# Patient Record
Sex: Male | Born: 1985 | Race: Black or African American | Hispanic: No | Marital: Single | Smoking: Current every day smoker
Health system: Southern US, Community
[De-identification: ages and names within clinical notes are randomized; demographics above are authoritative.]

---
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
## 2006-02-07 ENCOUNTER — Emergency Department (HOSPITAL_COMMUNITY): Admission: EM | Admit: 2006-02-07 | Discharge: 2006-02-08 | Payer: Self-pay | Admitting: Emergency Medicine

## 2006-03-09 ENCOUNTER — Emergency Department (HOSPITAL_COMMUNITY): Admission: EM | Admit: 2006-03-09 | Discharge: 2006-03-09 | Payer: Self-pay | Admitting: Emergency Medicine

## 2006-03-14 ENCOUNTER — Emergency Department (HOSPITAL_COMMUNITY): Admission: EM | Admit: 2006-03-14 | Discharge: 2006-03-14 | Payer: Self-pay | Admitting: Emergency Medicine

## 2006-04-10 ENCOUNTER — Emergency Department (HOSPITAL_COMMUNITY): Admission: EM | Admit: 2006-04-10 | Discharge: 2006-04-10 | Payer: Self-pay | Admitting: Emergency Medicine

## 2008-03-17 ENCOUNTER — Emergency Department (HOSPITAL_COMMUNITY): Admission: EM | Admit: 2008-03-17 | Discharge: 2008-03-17 | Payer: Self-pay | Admitting: Emergency Medicine

## 2010-07-20 ENCOUNTER — Emergency Department (HOSPITAL_COMMUNITY): Admission: EM | Admit: 2010-07-20 | Discharge: 2010-07-20 | Payer: Self-pay | Admitting: Emergency Medicine

## 2010-08-12 ENCOUNTER — Emergency Department (HOSPITAL_COMMUNITY): Admission: EM | Admit: 2010-08-12 | Discharge: 2010-08-12 | Payer: Self-pay | Admitting: Emergency Medicine

## 2010-09-20 ENCOUNTER — Emergency Department (HOSPITAL_COMMUNITY)
Admission: EM | Admit: 2010-09-20 | Discharge: 2010-09-20 | Payer: Self-pay | Source: Home / Self Care | Admitting: Emergency Medicine

## 2010-11-21 ENCOUNTER — Emergency Department (HOSPITAL_COMMUNITY): Payer: Medicaid Other

## 2010-11-21 ENCOUNTER — Emergency Department (HOSPITAL_COMMUNITY)
Admission: EM | Admit: 2010-11-21 | Discharge: 2010-11-21 | Disposition: A | Discharge status: Home / Self Care | Payer: Medicaid Other | Attending: Emergency Medicine | Admitting: Emergency Medicine

## 2010-11-21 DIAGNOSIS — X58XXXA Exposure to other specified factors, initial encounter: Secondary | ICD-10-CM | POA: Insufficient documentation

## 2010-11-21 DIAGNOSIS — S63509A Unspecified sprain of unspecified wrist, initial encounter: Secondary | ICD-10-CM | POA: Insufficient documentation

## 2011-01-11 ENCOUNTER — Emergency Department (HOSPITAL_COMMUNITY)
Admission: EM | Admit: 2011-01-11 | Discharge: 2011-01-11 | Disposition: A | Discharge status: Home / Self Care | Payer: Medicaid Other | Attending: Emergency Medicine | Admitting: Emergency Medicine

## 2011-01-11 DIAGNOSIS — M545 Low back pain, unspecified: Secondary | ICD-10-CM | POA: Insufficient documentation

## 2011-02-01 ENCOUNTER — Emergency Department (HOSPITAL_COMMUNITY)
Admission: EM | Admit: 2011-02-01 | Discharge: 2011-02-01 | Disposition: A | Discharge status: Home / Self Care | Payer: Medicaid Other | Attending: Emergency Medicine | Admitting: Emergency Medicine

## 2011-02-01 DIAGNOSIS — Y9269 Other specified industrial and construction area as the place of occurrence of the external cause: Secondary | ICD-10-CM | POA: Insufficient documentation

## 2011-02-01 DIAGNOSIS — X503XXA Overexertion from repetitive movements, initial encounter: Secondary | ICD-10-CM | POA: Insufficient documentation

## 2011-02-01 DIAGNOSIS — S335XXA Sprain of ligaments of lumbar spine, initial encounter: Secondary | ICD-10-CM | POA: Insufficient documentation

## 2011-03-12 ENCOUNTER — Emergency Department (HOSPITAL_COMMUNITY)
Admission: EM | Admit: 2011-03-12 | Discharge: 2011-03-12 | Disposition: A | Discharge status: Home / Self Care | Payer: Medicaid Other | Attending: Emergency Medicine | Admitting: Emergency Medicine

## 2011-03-12 DIAGNOSIS — F17200 Nicotine dependence, unspecified, uncomplicated: Secondary | ICD-10-CM | POA: Insufficient documentation

## 2011-03-12 DIAGNOSIS — M545 Low back pain, unspecified: Secondary | ICD-10-CM | POA: Insufficient documentation

## 2011-07-11 ENCOUNTER — Encounter: Payer: Self-pay | Admitting: *Deleted

## 2011-07-11 ENCOUNTER — Emergency Department (HOSPITAL_COMMUNITY)
Admission: EM | Admit: 2011-07-11 | Discharge: 2011-07-11 | Disposition: A | Discharge status: Home / Self Care | Payer: Medicaid Other | Attending: Emergency Medicine | Admitting: Emergency Medicine

## 2011-07-11 DIAGNOSIS — N63 Unspecified lump in unspecified breast: Secondary | ICD-10-CM

## 2011-07-11 DIAGNOSIS — F17200 Nicotine dependence, unspecified, uncomplicated: Secondary | ICD-10-CM | POA: Insufficient documentation

## 2011-07-11 MED ORDER — DOXYCYCLINE HYCLATE 100 MG PO TABS
100.0000 mg | ORAL_TABLET | Freq: Once | ORAL | Status: AC
  Administered 2011-07-11: 100 mg via ORAL
  Filled 2011-07-11: qty 1

## 2011-07-11 MED ORDER — DOXYCYCLINE HYCLATE 100 MG PO CAPS: 100.0000 mg | ORAL_CAPSULE | Freq: Two times a day (BID) | ORAL | Status: DC

## 2011-07-11 NOTE — ED Provider Notes (Signed)
History     CSN: [id number] Arrival date & time: 07/11/2011 11:16 AM   First MD Initiated Contact with Patient 07/11/11 1341      Chief Complaint  Patient presents with  . Cyst    (Consider location/radiation/quality/duration/timing/severity/associated sxs/prior treatment) HPI Comments: Pt has a painful mass in the L breast for over 1 week.  No trauma.  No fever.  The history is provided by the patient. No language interpreter was used.    History reviewed. No pertinent past medical history.  History reviewed. No pertinent past surgical history.  History reviewed. No pertinent family history.  History  Substance Use Topics  . Smoking status: Current Everyday Smoker -- 0.5 packs/day  . Smokeless tobacco: Not on file  . Alcohol Use: Yes     occasionally      Review of Systems  Skin:       mass  All other systems reviewed and are negative.    Allergies  Review of patient's allergies indicates no known allergies.  Home Medications  No current outpatient prescriptions on file.  BP 132/73  Pulse 81  Temp(Src) 98.6 F (37 C) (Oral)  Resp 20  Ht 5\' 11"  (1.803 m)  Wt 280 lb (127.007 kg)  BMI 39.05 kg/m2  SpO2 100%  Physical Exam  Nursing note and vitals reviewed. Constitutional: He is oriented to person, place, and time. He appears well-developed and well-nourished. No distress.  HENT:  Head: Normocephalic and atraumatic.  Eyes: EOM are normal.  Neck: Normal range of motion.  Cardiovascular: Normal rate, regular rhythm, normal heart sounds and intact distal pulses.   Pulmonary/Chest: Effort normal and breath sounds normal. No respiratory distress. He exhibits mass and tenderness. He exhibits no bony tenderness and no retraction.    Abdominal: Soft. He exhibits no distension. There is no tenderness.  Musculoskeletal: Normal range of motion.  Lymphadenopathy:    He has no axillary adenopathy.  Neurological: He is alert and oriented to person, place, and  time.  Skin: Skin is warm and dry.  Psychiatric: He has a normal mood and affect. Judgment normal.    ED Course  Procedures (including critical care time)  Labs Reviewed - No data to display No results found.   No diagnosis found.    MDM          Worthy Rancher, PA 07/11/11 1409

## 2011-07-11 NOTE — ED Notes (Signed)
\  [id number]\

## 2011-07-11 NOTE — ED Notes (Signed)
Pt states he has hard tender knot to left chest.

## 2011-07-13 NOTE — ED Provider Notes (Signed)
Medical screening examination/treatment/procedure(s) were performed by non-physician practitioner and as supervising physician I was immediately available for consultation/collaboration.   Swan Zayed L Roshad Hack, MD 07/13/11 0904 

## 2011-07-18 ENCOUNTER — Ambulatory Visit (HOSPITAL_COMMUNITY): Admit: 2011-07-18 | Payer: Medicaid Other

## 2011-07-18 ENCOUNTER — Encounter (HOSPITAL_COMMUNITY): Payer: Medicaid Other

## 2011-07-18 ENCOUNTER — Other Ambulatory Visit (HOSPITAL_COMMUNITY): Payer: Self-pay | Admitting: Emergency Medicine

## 2011-07-18 DIAGNOSIS — N63 Unspecified lump in unspecified breast: Secondary | ICD-10-CM

## 2011-07-19 ENCOUNTER — Emergency Department (HOSPITAL_COMMUNITY)
Admission: EM | Admit: 2011-07-19 | Discharge: 2011-07-19 | Disposition: A | Discharge status: Home / Self Care | Payer: Medicaid Other | Attending: Emergency Medicine | Admitting: Emergency Medicine

## 2011-07-19 ENCOUNTER — Encounter (HOSPITAL_COMMUNITY): Payer: Self-pay | Admitting: *Deleted

## 2011-07-19 ENCOUNTER — Other Ambulatory Visit (HOSPITAL_COMMUNITY): Payer: Medicaid Other

## 2011-07-19 DIAGNOSIS — K0889 Other specified disorders of teeth and supporting structures: Secondary | ICD-10-CM

## 2011-07-19 DIAGNOSIS — F17200 Nicotine dependence, unspecified, uncomplicated: Secondary | ICD-10-CM | POA: Insufficient documentation

## 2011-07-19 DIAGNOSIS — K089 Disorder of teeth and supporting structures, unspecified: Secondary | ICD-10-CM | POA: Insufficient documentation

## 2011-07-19 DIAGNOSIS — K029 Dental caries, unspecified: Secondary | ICD-10-CM | POA: Insufficient documentation

## 2011-07-19 MED ORDER — MELOXICAM 7.5 MG PO TABS: 7.5000 mg | ORAL_TABLET | Freq: Every day | ORAL | Status: DC

## 2011-07-19 MED ORDER — HYDROCODONE-ACETAMINOPHEN 5-325 MG PO TABS: 2.0000 | ORAL_TABLET | ORAL | Status: AC | PRN

## 2011-07-19 MED ORDER — PENICILLIN V POTASSIUM 500 MG PO TABS: ORAL_TABLET | ORAL | Status: DC

## 2011-07-19 NOTE — ED Notes (Signed)
C/o right lower toothache x 2 days; states tooth is broken

## 2011-07-19 NOTE — ED Provider Notes (Signed)
History     CSN: [id number] Arrival date & time: 07/19/2011 11:04 AM   First MD Initiated Contact with Patient 07/19/11 1156      Chief Complaint  Patient presents with  . Dental Pain    x 2 days    (Consider location/radiation/quality/duration/timing/severity/associated sxs/prior treatment) HPI Comments: Pt  States he had a tooth to break about 1 month ago. 2 days ago he developed increasing pain on the right lower jaw area.  Patient is a 25 y.o. male presenting with tooth pain. The history is provided by the patient.  Dental PainThe primary symptoms include mouth pain and dental injury. Primary symptoms do not include fever, shortness of breath or cough. The symptoms began 2 days ago. The symptoms are worsening. The symptoms occur intermittently.  Additional symptoms include: dental sensitivity to temperature, gum swelling, gum tenderness and jaw pain. Additional symptoms do not include: nosebleeds. Medical issues include: alcohol problem and smoking.    History reviewed. No pertinent past medical history.  History reviewed. No pertinent past surgical history.  No family history on file.  History  Substance Use Topics  . Smoking status: Current Everyday Smoker -- 0.5 packs/day    Types: Cigarettes  . Smokeless tobacco: Not on file  . Alcohol Use: Yes     occasionally      Review of Systems  Constitutional: Negative for fever and activity change.       All ROS Neg except as noted in HPI  HENT: Positive for dental problem. Negative for nosebleeds and neck pain.   Eyes: Negative for photophobia and discharge.  Respiratory: Negative for cough, shortness of breath and wheezing.   Cardiovascular: Negative for chest pain and palpitations.  Gastrointestinal: Negative for abdominal pain and blood in stool.  Genitourinary: Negative for dysuria, frequency and hematuria.  Musculoskeletal: Negative for back pain and arthralgias.  Skin: Negative.   Neurological: Negative for  dizziness, seizures and speech difficulty.  Psychiatric/Behavioral: Negative for hallucinations and confusion.    Allergies  Review of patient's allergies indicates no known allergies.  Home Medications   Current Outpatient Rx  Name Route Sig Dispense Refill  . HYDROCODONE-ACETAMINOPHEN 5-325 MG PO TABS Oral Take 2 tablets by mouth every 4 (four) hours as needed for pain. 20 tablet 0  . MELOXICAM 7.5 MG PO TABS Oral Take 1 tablet (7.5 mg total) by mouth daily. 14 tablet 0  . PENICILLIN V POTASSIUM 500 MG PO TABS  2 po bid with food 28 tablet 0    BP 127/72  Pulse 91  Temp(Src) 98.6 F (37 C) (Oral)  Resp 20  Ht 6' (1.829 m)  Wt 250 lb (113.399 kg)  BMI 33.91 kg/m2  SpO2 94%  Physical Exam  Nursing note and vitals reviewed. Constitutional: He is oriented to person, place, and time. He appears well-developed and well-nourished.  Non-toxic appearance.  HENT:  Head: Normocephalic.  Right Ear: Tympanic membrane and external ear normal.  Left Ear: Tympanic membrane and external ear normal.  Mouth/Throat: Dental caries present.  Eyes: EOM and lids are normal. Pupils are equal, round, and reactive to light.  Neck: Normal range of motion. Neck supple. Carotid bruit is not present.  Cardiovascular: Normal rate, regular rhythm, normal heart sounds, intact distal pulses and normal pulses.   Pulmonary/Chest: Breath sounds normal. No respiratory distress.  Abdominal: Soft. Bowel sounds are normal. There is no tenderness. There is no guarding.  Musculoskeletal: Normal range of motion.  Lymphadenopathy:  Head (right side): No submandibular adenopathy present.       Head (left side): No submandibular adenopathy present.    He has no cervical adenopathy.  Neurological: He is alert and oriented to person, place, and time. He has normal strength. No cranial nerve deficit or sensory deficit.  Skin: Skin is warm and dry.  Psychiatric: He has a normal mood and affect. His speech is  normal.    ED Course: Pt to see a dentist as soon as possible.  Procedures (including critical care time)  Labs Reviewed - No data to display No results found.   1. Toothache       MDM  I have reviewed nursing notes, vital signs, and all appropriate lab and imaging results for this patient.        Kathie Dike, [place] 07/19/11 1745

## 2011-07-20 NOTE — ED Provider Notes (Signed)
Medical screening examination/treatment/procedure(s) were performed by non-physician practitioner and as supervising physician I was immediately available for consultation/collaboration.  Danely Bayliss, MD 07/20/11 0744 

## 2011-08-24 ENCOUNTER — Encounter (HOSPITAL_COMMUNITY): Payer: Self-pay | Admitting: *Deleted

## 2011-08-24 ENCOUNTER — Emergency Department (HOSPITAL_COMMUNITY)
Admission: EM | Admit: 2011-08-24 | Discharge: 2011-08-24 | Disposition: A | Discharge status: Home / Self Care | Payer: Medicaid Other | Attending: Emergency Medicine | Admitting: Emergency Medicine

## 2011-08-24 DIAGNOSIS — K0262 Dental caries on smooth surface penetrating into dentin: Secondary | ICD-10-CM | POA: Insufficient documentation

## 2011-08-24 DIAGNOSIS — F17200 Nicotine dependence, unspecified, uncomplicated: Secondary | ICD-10-CM | POA: Insufficient documentation

## 2011-08-24 DIAGNOSIS — K047 Periapical abscess without sinus: Secondary | ICD-10-CM | POA: Insufficient documentation

## 2011-08-24 DIAGNOSIS — R51 Headache: Secondary | ICD-10-CM | POA: Insufficient documentation

## 2011-08-24 DIAGNOSIS — H9209 Otalgia, unspecified ear: Secondary | ICD-10-CM | POA: Insufficient documentation

## 2011-08-24 MED ORDER — HYDROCODONE-ACETAMINOPHEN 5-325 MG PO TABS: 1.0000 | ORAL_TABLET | ORAL | Status: AC | PRN

## 2011-08-24 MED ORDER — PENICILLIN V POTASSIUM 500 MG PO TABS: 500.0000 mg | ORAL_TABLET | Freq: Four times a day (QID) | ORAL | Status: AC

## 2011-08-24 MED ORDER — NAPROXEN 500 MG PO TABS: 500.0000 mg | ORAL_TABLET | Freq: Two times a day (BID) | ORAL | Status: DC

## 2011-08-24 NOTE — ED Provider Notes (Signed)
Medical screening examination/treatment/procedure(s) were performed by non-physician practitioner and as supervising physician I was immediately available for consultation/collaboration.  Donnetta Hutching, MD 08/24/11 [phone]

## 2011-08-24 NOTE — ED Notes (Signed)
Pt c/o right ear pain and right sided tooth pain; pt states he has an appoint in January to have tooth pulled

## 2011-08-24 NOTE — ED Provider Notes (Signed)
History     CSN: [id number] Arrival date & time: 08/24/2011 10:56 AM   First MD Initiated Contact with Patient 08/24/11 1058      Chief Complaint  Patient presents with  . Otalgia  . Dental Pain  . Headache    (Consider location/radiation/quality/duration/timing/severity/associated sxs/prior treatment) Patient is a 25 y.o. male presenting with ear pain, tooth pain, and headaches. The history is provided by the patient.  Otalgia This is a new problem. The current episode started more than 2 days ago. There is pain in the right ear. The problem occurs constantly. The problem has not changed since onset.There has been no fever. The pain is at a severity of 10/10. The pain is severe. Associated symptoms include headaches. Pertinent negatives include no sore throat. Associated symptoms comments: Dental problem .  Dental PainThe primary symptoms include mouth pain and headaches. Primary symptoms do not include sore throat. The symptoms began more than 1 week ago. The symptoms are worsening. The symptoms occur constantly.  Additional symptoms include: dental sensitivity to temperature, facial swelling and ear pain. Additional symptoms do not include: gum swelling, gum tenderness, purulent gums and trouble swallowing.   Headache     History reviewed. No pertinent past medical history.  History reviewed. No pertinent past surgical history.  History reviewed. No pertinent family history.  History  Substance Use Topics  . Smoking status: Current Everyday Smoker -- 0.5 packs/day    Types: Cigarettes  . Smokeless tobacco: Not on file  . Alcohol Use: Yes     occasionally      Review of Systems  HENT: Positive for ear pain, facial swelling and dental problem. Negative for sore throat and trouble swallowing.   Neurological: Positive for headaches.    Allergies  Review of patient's allergies indicates no known allergies.  Home Medications   Current Outpatient Rx  Name Route Sig  Dispense Refill  . HYDROCODONE-ACETAMINOPHEN 5-325 MG PO TABS Oral Take 1 tablet by mouth every 4 (four) hours as needed for pain. 15 tablet 0  . MELOXICAM 7.5 MG PO TABS Oral Take 1 tablet (7.5 mg total) by mouth daily. 14 tablet 0  . NAPROXEN 500 MG PO TABS Oral Take 1 tablet (500 mg total) by mouth 2 (two) times daily with a meal. 20 tablet 0  . PENICILLIN V POTASSIUM 500 MG PO TABS  2 po bid with food 28 tablet 0  . PENICILLIN V POTASSIUM 500 MG PO TABS Oral Take 1 tablet (500 mg total) by mouth 4 (four) times daily. 40 tablet 0    BP 143/82  Pulse 81  Temp(Src) 98.2 F (36.8 C) (Oral)  Resp 18  Ht 5\' 11"  (1.803 m)  Wt 250 lb (113.399 kg)  BMI 34.87 kg/m2  SpO2 100%  Physical Exam  Constitutional: He is oriented to person, place, and time. He appears well-developed and well-nourished. No distress.  HENT:  Head: Normocephalic and atraumatic.  Right Ear: Tympanic membrane and external ear normal.  Left Ear: Tympanic membrane and external ear normal.  Mouth/Throat: Oropharynx is clear and moist and mucous membranes are normal. No oral lesions. Dental abscesses present.         Deep caries in this tooth,  No gingival edema.  Eyes: Conjunctivae are normal.  Neck: Normal range of motion. Neck supple.  Cardiovascular: Normal rate and normal heart sounds.   Pulmonary/Chest: Effort normal.  Abdominal: He exhibits no distension.  Musculoskeletal: Normal range of motion.  Lymphadenopathy:    He  has no cervical adenopathy.  Neurological: He is alert and oriented to person, place, and time.  Skin: Skin is warm and dry. No erythema.  Psychiatric: He has a normal mood and affect.    ED Course  Procedures (including critical care time)  Labs Reviewed - No data to display No results found.   1. Dental caries extending into dentine       MDM  Dental pain from caries.  No obvious infection at this time.  Patient was referred to his dentist to Dr. Barbette Merino for dental extraction of  this tooth but will not be seen until 1.7.13        Candis Musa, PA 08/24/11 1156

## 2011-08-24 NOTE — ED Notes (Signed)
Pt a/ox4. resp even and unlabored. NAD at this time. D/C instructions and Rx x3 reviewed with pt. Pt verbalized understanding. Pt ambulated to lobby with steady gate.

## 2011-08-24 NOTE — ED Notes (Signed)
Pt presents with right upper tooth pain and right ear pain. Pt states he has an appointment at the beginning of the year to have tooth pulled but the pain is "too much".

## 2012-02-11 ENCOUNTER — Emergency Department: Payer: Self-pay | Admitting: *Deleted

## 2012-02-27 ENCOUNTER — Emergency Department (HOSPITAL_COMMUNITY): Payer: Medicaid Other

## 2012-02-27 ENCOUNTER — Encounter (HOSPITAL_COMMUNITY): Payer: Self-pay | Admitting: Emergency Medicine

## 2012-02-27 ENCOUNTER — Emergency Department (HOSPITAL_COMMUNITY)
Admission: EM | Admit: 2012-02-27 | Discharge: 2012-02-27 | Disposition: A | Discharge status: Home / Self Care | Payer: Medicaid Other | Attending: Emergency Medicine | Admitting: Emergency Medicine

## 2012-02-27 DIAGNOSIS — M79672 Pain in left foot: Secondary | ICD-10-CM

## 2012-02-27 DIAGNOSIS — F17200 Nicotine dependence, unspecified, uncomplicated: Secondary | ICD-10-CM | POA: Insufficient documentation

## 2012-02-27 DIAGNOSIS — M79609 Pain in unspecified limb: Secondary | ICD-10-CM | POA: Insufficient documentation

## 2012-02-27 MED ORDER — IBUPROFEN 400 MG PO TABS
400.0000 mg | ORAL_TABLET | Freq: Once | ORAL | Status: AC
  Administered 2012-02-27: 400 mg via ORAL
  Filled 2012-02-27: qty 1

## 2012-02-27 MED ORDER — TRAMADOL HCL 50 MG PO TABS: 50.0000 mg | ORAL_TABLET | Freq: Four times a day (QID) | ORAL | Status: AC | PRN

## 2012-02-27 MED ORDER — DIPHENHYDRAMINE HCL 25 MG PO CAPS
ORAL_CAPSULE | ORAL | Status: AC
  Filled 2012-02-27: qty 2

## 2012-02-27 MED ORDER — DIPHENHYDRAMINE HCL 25 MG PO CAPS
50.0000 mg | ORAL_CAPSULE | Freq: Once | ORAL | Status: AC
  Administered 2012-02-27: 50 mg via ORAL

## 2012-02-27 NOTE — ED Notes (Signed)
Pt denies any injury, complains of bilateral foot pain that radiates up his leg. Pain has been increasing over the last month. Pt wears steel toed shoes & walks on concrete at work.

## 2012-02-27 NOTE — ED Notes (Signed)
Patient complaining of bilateral foot pain x 1 month. Denies injury.

## 2012-02-27 NOTE — ED Provider Notes (Signed)
History     CSN: [id number]  Arrival date & time 02/27/12  2107   First MD Initiated Contact with Patient 02/27/12 2142      Chief Complaint  Patient presents with  . 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  No known injury to feet.  Clinically he appears to be flat footed.  Patient is a 26 y.o. male presenting with lower extremity pain. The history is provided by the patient. No language interpreter was used.  Foot Pain This is a chronic problem. Episode onset: hurting in B feet for aver 1 month. The problem occurs constantly. Pertinent negatives include no weakness. The symptoms are aggravated by walking and standing. He has tried nothing for the symptoms.    History reviewed. No pertinent past medical history.  History reviewed. No pertinent past surgical history.  History reviewed. No pertinent family history.  History  Substance Use Topics  . Smoking status: Current Everyday Smoker -- 0.5 packs/day    Types: Cigarettes  . Smokeless tobacco: Not on file  . Alcohol Use: Yes     occasionally      Review of Systems  Musculoskeletal:       Foot pain   Neurological: Negative for weakness.  All other systems reviewed and are negative.    Allergies  Ibuprofen  Home Medications   Current Outpatient Rx  Name Route Sig Dispense Refill  . ALBUTEROL SULFATE HFA 108 (90 BASE) MCG/ACT IN AERS Inhalation Inhale 2 puffs into the lungs every 6 (six) hours as needed.    . IBUPROFEN 200 MG PO TABS Oral Take 400 mg by mouth as needed.      BP 128/74  Pulse 86  Temp 97.9 F (36.6 C) (Oral)  Resp 16  Ht 5\' 11"  (1.803 m)  Wt 250 lb (113.399 kg)  BMI 34.87 kg/m2  SpO2 98%  Physical Exam  Nursing note and vitals reviewed. Constitutional: He is oriented to person, place, and time. He appears well-developed  and well-nourished.  HENT:  Head: Normocephalic and atraumatic.  Eyes: EOM are normal.  Neck: Normal range of motion.  Cardiovascular: Normal rate, regular rhythm, normal heart sounds and intact distal pulses.   Pulmonary/Chest: Effort normal and breath sounds normal. No respiratory distress.  Abdominal: Soft. He exhibits no distension. There is no tenderness.  Musculoskeletal: Normal range of motion.       Both feet appear to have no arch at all.  He localizes hid pain to the plantar midfoot and heel areas.  Right is worse than the left.  Pain with standing and walking.  Neurological: He is alert and oriented to person, place, and time.  Skin: Skin is warm and dry.  Psychiatric: He has a normal mood and affect. Judgment normal.    ED Course  Procedures (including critical care time)  Labs Reviewed - No data to display Dg Foot Complete Right  02/27/2012  *RADIOLOGY REPORT*  Clinical Data: Right foot pain  RIGHT FOOT COMPLETE - 3+ VIEW  Comparison: None.  Findings: No displaced fracture or dislocation.  No aggressive osseous lesion.  IMPRESSION: No acute osseous abnormality identified.  Original Report Authenticated By: Waneta Martins, M.D.     1. Bilateral foot pain       MDM  No acute abnorm on XR.  Call dr. Regal(podiatry) and make an appt for further evaluation.  Worthy Rancher, PA 02/27/12 [phone]

## 2012-02-27 NOTE — Discharge Instructions (Signed)
The radiologist does not see any acute bony abnormalities.  Call dr. Charlsie Merles and make an appt for further evaluation of your feet.

## 2012-02-29 NOTE — ED Provider Notes (Signed)
Medical screening examination/treatment/procedure(s) were performed by non-physician practitioner and as supervising physician I was immediately available for consultation/collaboration.   Laray Anger, DO 02/29/12 1105

## 2012-03-30 ENCOUNTER — Emergency Department (HOSPITAL_COMMUNITY)
Admission: EM | Admit: 2012-03-30 | Discharge: 2012-03-30 | Disposition: A | Discharge status: Home / Self Care | Payer: Medicaid Other | Attending: Emergency Medicine | Admitting: Emergency Medicine

## 2012-03-30 ENCOUNTER — Encounter (HOSPITAL_COMMUNITY): Payer: Self-pay

## 2012-03-30 DIAGNOSIS — M545 Low back pain, unspecified: Secondary | ICD-10-CM | POA: Insufficient documentation

## 2012-03-30 DIAGNOSIS — X500XXA Overexertion from strenuous movement or load, initial encounter: Secondary | ICD-10-CM | POA: Insufficient documentation

## 2012-03-30 DIAGNOSIS — F17200 Nicotine dependence, unspecified, uncomplicated: Secondary | ICD-10-CM | POA: Insufficient documentation

## 2012-03-30 DIAGNOSIS — Z79899 Other long term (current) drug therapy: Secondary | ICD-10-CM | POA: Insufficient documentation

## 2012-03-30 DIAGNOSIS — S39012A Strain of muscle, fascia and tendon of lower back, initial encounter: Secondary | ICD-10-CM

## 2012-03-30 DIAGNOSIS — S335XXA Sprain of ligaments of lumbar spine, initial encounter: Secondary | ICD-10-CM | POA: Insufficient documentation

## 2012-03-30 MED ORDER — IBUPROFEN 800 MG PO TABS: 800.0000 mg | ORAL_TABLET | Freq: Once | ORAL | Status: DC

## 2012-03-30 MED ORDER — HYDROCODONE-ACETAMINOPHEN 5-325 MG PO TABS
1.0000 | ORAL_TABLET | Freq: Once | ORAL | Status: AC
  Administered 2012-03-30: 1 via ORAL
  Filled 2012-03-30: qty 1

## 2012-03-30 MED ORDER — HYDROCODONE-ACETAMINOPHEN 5-325 MG PO TABS: 1.0000 | ORAL_TABLET | Freq: Four times a day (QID) | ORAL | Status: AC | PRN

## 2012-03-30 NOTE — ED Notes (Signed)
Patient with no complaints at this time. Respirations even and unlabored. Skin warm/dry. Discharge instructions reviewed with patient at this time. Patient given opportunity to voice concerns/ask questions. Patient discharged at this time and left Emergency Department with steady gait.   

## 2012-03-30 NOTE — ED Notes (Signed)
Pt reports back pain since yesterday, works for a [employer]

## 2012-03-30 NOTE — ED Provider Notes (Signed)
History     CSN: [id number]  Arrival date & time 03/30/12  1135   First MD Initiated Contact with Patient 03/30/12 1242      Chief Complaint  Patient presents with  . Back Pain    (Consider location/radiation/quality/duration/timing/severity/associated sxs/prior treatment) HPI Comments: Pt having low back pain.  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  No radicular sxs.  No previous back problems.  Patient is a 26 y.o. male presenting with back pain. The history is provided by the patient. No language interpreter was used.  Back Pain  This is a new problem. The current episode started 2 days ago. The problem occurs constantly. The pain is associated with lifting heavy objects. The pain is present in the lumbar spine. The quality of the pain is described as aching. The pain does not radiate. The pain is at a severity of 7/10. The symptoms are aggravated by bending and twisting. The pain is the same all the time. Pertinent negatives include no numbness, no bowel incontinence, no perianal numbness, no bladder incontinence, no dysuria, no leg pain, no paresthesias, no paresis, no tingling and no weakness. He has tried nothing for the symptoms.    History reviewed. No pertinent past medical history.  History reviewed. No pertinent past surgical history.  No family history on file.  History  Substance Use Topics  . Smoking status: Current Everyday Smoker -- 0.5 packs/day    Types: Cigarettes  . Smokeless tobacco: Not on file  . Alcohol Use: Yes     occasionally      Review of Systems  Gastrointestinal: Negative for bowel incontinence.  Genitourinary: Negative for bladder incontinence and dysuria.  Musculoskeletal: Positive for back pain.  Neurological: Negative for tingling, weakness, numbness and paresthesias.  All other systems reviewed and are negative.    Allergies  Ibuprofen  Home Medications   Current Outpatient Rx  Name Route Sig Dispense  Refill  . DIPHENHYDRAMINE-APAP (SLEEP) 25-500 MG PO TABS Oral Take 1 tablet by mouth at bedtime as needed.    . ALBUTEROL SULFATE HFA 108 (90 BASE) MCG/ACT IN AERS Inhalation Inhale 2 puffs into the lungs every 6 (six) hours as needed. Wheezing/Coughing/Shortness of Breath    . HYDROCODONE-ACETAMINOPHEN 5-325 MG PO TABS Oral Take 1 tablet by mouth every 6 (six) hours as needed for pain. 20 tablet 0    BP 145/75  Pulse 91  Temp 98 F (36.7 C) (Oral)  Resp 20  Ht 5\' 10"  (1.778 m)  Wt 250 lb (113.399 kg)  BMI 35.87 kg/m2  SpO2 100%  Physical Exam  Nursing note and vitals reviewed. Constitutional: He is oriented to person, place, and time. He appears well-developed and well-nourished.  HENT:  Head: Normocephalic and atraumatic.  Eyes: EOM are normal.  Neck: Normal range of motion.  Cardiovascular: Normal rate, regular rhythm, normal heart sounds and intact distal pulses.   Pulmonary/Chest: Effort normal and breath sounds normal. No respiratory distress.  Abdominal: Soft. He exhibits no distension. There is no tenderness.  Musculoskeletal: He exhibits tenderness.       Lumbar back: He exhibits decreased range of motion, tenderness and pain. He exhibits no bony tenderness.       Back:  Neurological: He is alert and oriented to person, place, and time.  Skin: Skin is warm and dry.  Psychiatric: He has a normal mood and affect. Judgment normal.    ED Course  Procedures (including critical care time)  Labs  Reviewed - No data to display No results found.   1. Lumbar strain       MDM  No incontinence or saddle dysthesia        Evalina Field, [place] 03/30/12 1255

## 2012-03-30 NOTE — ED Provider Notes (Signed)
Medical screening examination/treatment/procedure(s) were performed by non-physician practitioner and as supervising physician I was immediately available for consultation/collaboration.   Madeleyn Schwimmer L Qusai Kem, MD 03/30/12 1513 

## 2012-04-16 ENCOUNTER — Emergency Department: Payer: Self-pay | Admitting: Emergency Medicine

## 2012-10-22 ENCOUNTER — Emergency Department: Payer: Self-pay | Admitting: Emergency Medicine

## 2012-11-05 ENCOUNTER — Emergency Department (HOSPITAL_COMMUNITY)
Admission: EM | Admit: 2012-11-05 | Discharge: 2012-11-05 | Disposition: A | Discharge status: Home / Self Care | Payer: Medicaid Other | Attending: Emergency Medicine | Admitting: Emergency Medicine

## 2012-11-05 ENCOUNTER — Encounter (HOSPITAL_COMMUNITY): Payer: Self-pay | Admitting: *Deleted

## 2012-11-05 ENCOUNTER — Emergency Department (HOSPITAL_COMMUNITY): Payer: Medicaid Other

## 2012-11-05 DIAGNOSIS — F17200 Nicotine dependence, unspecified, uncomplicated: Secondary | ICD-10-CM | POA: Insufficient documentation

## 2012-11-05 DIAGNOSIS — M65839 Other synovitis and tenosynovitis, unspecified forearm: Secondary | ICD-10-CM | POA: Insufficient documentation

## 2012-11-05 DIAGNOSIS — Z87828 Personal history of other (healed) physical injury and trauma: Secondary | ICD-10-CM | POA: Insufficient documentation

## 2012-11-05 DIAGNOSIS — Z79899 Other long term (current) drug therapy: Secondary | ICD-10-CM | POA: Insufficient documentation

## 2012-11-05 MED ORDER — PREDNISONE 10 MG PO TABS: ORAL_TABLET | ORAL | Status: DC

## 2012-11-05 MED ORDER — TRAMADOL HCL 50 MG PO TABS: 50.0000 mg | ORAL_TABLET | Freq: Four times a day (QID) | ORAL | Status: DC | PRN
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
## 2012-11-06 NOTE — ED Provider Notes (Signed)
Medical screening examination/treatment/procedure(s) were performed by non-physician practitioner and as supervising physician I was immediately available for consultation/collaboration. Devoria Albe, MD, Armando Gang   Ward Givens, MD 11/06/12 [phone]

## 2012-12-08 ENCOUNTER — Emergency Department: Payer: Self-pay | Admitting: Emergency Medicine

## 2013-03-11 ENCOUNTER — Emergency Department (HOSPITAL_COMMUNITY)
Admission: EM | Admit: 2013-03-11 | Discharge: 2013-03-11 | Disposition: A | Discharge status: Home / Self Care | Payer: Medicaid Other | Attending: Emergency Medicine | Admitting: Emergency Medicine

## 2013-03-11 ENCOUNTER — Encounter (HOSPITAL_COMMUNITY): Payer: Self-pay | Admitting: Emergency Medicine

## 2013-03-11 DIAGNOSIS — Z79899 Other long term (current) drug therapy: Secondary | ICD-10-CM | POA: Insufficient documentation

## 2013-03-11 DIAGNOSIS — R51 Headache: Secondary | ICD-10-CM | POA: Insufficient documentation

## 2013-03-11 DIAGNOSIS — F17200 Nicotine dependence, unspecified, uncomplicated: Secondary | ICD-10-CM | POA: Insufficient documentation

## 2013-03-11 DIAGNOSIS — K0889 Other specified disorders of teeth and supporting structures: Secondary | ICD-10-CM

## 2013-03-11 DIAGNOSIS — K089 Disorder of teeth and supporting structures, unspecified: Secondary | ICD-10-CM | POA: Insufficient documentation

## 2013-03-11 MED ORDER — AMOXICILLIN 250 MG PO CAPS: 250.0000 mg | ORAL_CAPSULE | Freq: Three times a day (TID) | ORAL | Status: DC

## 2013-03-11 MED ORDER — HYDROCODONE-ACETAMINOPHEN 5-325 MG PO TABS: 1.0000 | ORAL_TABLET | ORAL | Status: DC | PRN

## 2013-03-11 NOTE — ED Notes (Signed)
Patient c/o right lower dental pain. Per patient started yesterday. Patient reports taking tylenol and ibuprofen for the pain with no relief.

## 2013-03-11 NOTE — ED Provider Notes (Signed)
History    CSN: [id number] Arrival date & time 03/11/13  1728  First MD Initiated Contact with Patient 03/11/13 1734     Chief Complaint  Patient presents with  . Dental Pain   (Consider location/radiation/quality/duration/timing/severity/associated sxs/prior Treatment) Patient is a 27 y.o. male presenting with tooth pain. The history is provided by the patient.  Dental Pain Location:  Lower Lower teeth location:  32/RL 3rd molar Quality:  Throbbing and constant Onset quality:  Gradual Duration:  2 days Timing:  Constant Progression:  Worsening Chronicity:  New Worsened by:  Jaw movement and pressure Ineffective treatments:  Acetaminophen and NSAIDs Associated symptoms: facial pain   Associated symptoms: no congestion, no difficulty swallowing, no facial swelling, no fever, no headaches, no neck pain and no neck swelling    Lance Adams is a 27 y.o. male who presents to the ED with pain in the wisdom tooth on the lower right x 2 days. The tooth is coming in side ways and the pain is severe. There is tenderness and redness around the tooth.   History reviewed. No pertinent past medical history. History reviewed. No pertinent past surgical history. History reviewed. No pertinent family history. History  Substance Use Topics  . Smoking status: Current Every Day Smoker -- 0.50 packs/day for 10 years    Types: Cigarettes  . Smokeless tobacco: Never Used  . Alcohol Use: Yes     Comment: occasionally    Review of Systems  Constitutional: Negative for fever and chills.  HENT: Positive for dental problem. Negative for congestion, facial swelling and neck pain.   Respiratory: Negative for shortness of breath.   Gastrointestinal: Negative for nausea, vomiting and abdominal pain.  Skin: Negative for rash.  Neurological: Negative for headaches.  Psychiatric/Behavioral: The patient is not nervous/anxious.     Allergies  Ibuprofen  Home Medications   Current Outpatient  Rx  Name  Route  Sig  Dispense  Refill  . albuterol (PROVENTIL HFA;VENTOLIN HFA) 108 (90 BASE) MCG/ACT inhaler   Inhalation   Inhale 2 puffs into the lungs every 6 (six) hours as needed. Wheezing/Coughing/Shortness of Breath         . diphenhydramine-acetaminophen (TYLENOL PM) 25-500 MG TABS   Oral   Take 1 tablet by mouth at bedtime as needed.         Marland Kitchen oxyCODONE-acetaminophen (PERCOCET/ROXICET) 5-325 MG per tablet   Oral   Take 2 tablets by mouth once as needed for pain.         . predniSONE (DELTASONE) 10 MG tablet      6, 5, 4, 3, 2 then 1 tablet by mouth daily for 6 days total.   21 tablet   0   . traMADol (ULTRAM) 50 MG tablet   Oral   Take 1 tablet (50 mg total) by mouth every 6 (six) hours as needed for pain.   15 tablet   0    BP 117/64  Pulse 81  Temp(Src) 97.1 F (36.2 C) (Oral)  Resp 18  Ht 5\' 11"  (1.803 m)  Wt 260 lb (117.935 kg)  BMI 36.28 kg/m2  SpO2 100% Physical Exam  Nursing note and vitals reviewed. Constitutional: He is oriented to person, place, and time. He appears well-developed and well-nourished. No distress.  HENT:  Right Ear: Tympanic membrane normal.  Left Ear: Tympanic membrane normal.  Nose: Nose normal.  Mouth/Throat: Uvula is midline, oropharynx is clear and moist and mucous membranes are normal.  Lower right third  molar with partial eruption and turned sideways. Tender with palpation. Gum surrounding the tooth with erythema.  Eyes: EOM are normal.  Neck: Neck supple.  Pulmonary/Chest: Effort normal.  Abdominal: Soft. There is no tenderness.  Musculoskeletal: Normal range of motion.  Lymphadenopathy:    He has cervical adenopathy (right).  Neurological: He is alert and oriented to person, place, and time. No cranial nerve deficit.  Skin: Skin is warm and dry.    ED Course  Procedures  MDM  27 y.o. male with dental pain due to eruption of lower right third molar. Will treat pain and for possible infection. He is to  follow up with the dental clinic as soon as possible.  Discussed with the patient clinical findings and plan of care. All questioned fully answered. He will return if any problems arise.    Medication List    TAKE these medications       amoxicillin 250 MG capsule  Commonly known as:  AMOXIL  Take 1 capsule (250 mg total) by mouth 3 (three) times daily.     HYDROcodone-acetaminophen 5-325 MG per tablet  Commonly known as:  NORCO/VICODIN  Take 1 tablet by mouth every 4 (four) hours as needed.      ASK your doctor about these medications       albuterol 108 (90 BASE) MCG/ACT inhaler  Commonly known as:  PROVENTIL HFA;VENTOLIN HFA  Inhale 2 puffs into the lungs every 6 (six) hours as needed. Wheezing/Coughing/Shortness of Breath     diphenhydramine-acetaminophen 25-500 MG Tabs  Commonly known as:  TYLENOL PM  Take 1 tablet by mouth at bedtime as needed.     oxyCODONE-acetaminophen 5-325 MG per tablet  Commonly known as:  PERCOCET/ROXICET  Take 2 tablets by mouth once as needed for pain.     predniSONE 10 MG tablet  Commonly known as:  DELTASONE  6, 5, 4, 3, 2 then 1 tablet by mouth daily for 6 days total.     traMADol 50 MG tablet  Commonly known as:  ULTRAM  Take 1 tablet (50 mg total) by mouth every 6 (six) hours as needed for pain.         [facility] Orlene Och, [place] 03/11/13 [phone]

## 2013-03-12 NOTE — ED Provider Notes (Signed)
Medical screening examination/treatment/procedure(s) were performed by non-physician practitioner and as supervising physician I was immediately available for consultation/collaboration.   Carleene Cooper III, MD 03/12/13 1455

## 2013-05-07 ENCOUNTER — Emergency Department (HOSPITAL_COMMUNITY)
Admission: EM | Admit: 2013-05-07 | Discharge: 2013-05-07 | Disposition: A | Discharge status: Home / Self Care | Payer: Medicaid Other | Attending: Emergency Medicine | Admitting: Emergency Medicine

## 2013-05-07 ENCOUNTER — Encounter (HOSPITAL_COMMUNITY): Payer: Self-pay | Admitting: Emergency Medicine

## 2013-05-07 ENCOUNTER — Emergency Department (HOSPITAL_COMMUNITY): Payer: Medicaid Other

## 2013-05-07 DIAGNOSIS — Z792 Long term (current) use of antibiotics: Secondary | ICD-10-CM | POA: Insufficient documentation

## 2013-05-07 DIAGNOSIS — F17200 Nicotine dependence, unspecified, uncomplicated: Secondary | ICD-10-CM | POA: Insufficient documentation

## 2013-05-07 DIAGNOSIS — S46812A Strain of other muscles, fascia and tendons at shoulder and upper arm level, left arm, initial encounter: Secondary | ICD-10-CM

## 2013-05-07 DIAGNOSIS — Y929 Unspecified place or not applicable: Secondary | ICD-10-CM | POA: Insufficient documentation

## 2013-05-07 DIAGNOSIS — S43499A Other sprain of unspecified shoulder joint, initial encounter: Secondary | ICD-10-CM | POA: Insufficient documentation

## 2013-05-07 DIAGNOSIS — IMO0002 Reserved for concepts with insufficient information to code with codable children: Secondary | ICD-10-CM | POA: Insufficient documentation

## 2013-05-07 DIAGNOSIS — Z79899 Other long term (current) drug therapy: Secondary | ICD-10-CM | POA: Insufficient documentation

## 2013-05-07 DIAGNOSIS — X503XXA Overexertion from repetitive movements, initial encounter: Secondary | ICD-10-CM | POA: Insufficient documentation

## 2013-05-07 DIAGNOSIS — Y9389 Activity, other specified: Secondary | ICD-10-CM | POA: Insufficient documentation

## 2013-05-07 MED ORDER — METHOCARBAMOL 500 MG PO TABS: ORAL_TABLET | ORAL | Status: DC

## 2013-05-07 MED ORDER — TRAMADOL HCL 50 MG PO TABS: ORAL_TABLET | ORAL | Status: DC

## 2013-05-07 NOTE — ED Provider Notes (Signed)
Medical screening examination/treatment/procedure(s) were performed by non-physician practitioner and as supervising physician I was immediately available for consultation/collaboration.    Jairo Bellew R Sylis Ketchum, MD 05/07/13 1537 

## 2013-05-07 NOTE — ED Notes (Signed)
Pt c/o pain to the L side of his neck after lifting weights overhead yesterday.

## 2013-05-07 NOTE — Discharge Instructions (Signed)
Please apply heat to the neck and shoulder area. Use robaxin and tramadol for pain and spasm. These medications may cause drowsiness, use with caution. See Dr Romeo Apple for evaluation and management if not improving.

## 2013-05-07 NOTE — ED Provider Notes (Signed)
CSN: [id number]     Arrival date & time 05/07/13  1209 History     First MD Initiated Contact with Patient 05/07/13 1315     Chief Complaint  Patient presents with  . Neck Pain   (Consider location/radiation/quality/duration/timing/severity/associated sxs/prior Treatment) Patient is a 27 y.o. male presenting with neck pain. The history is provided by the patient.  Neck Pain Pain location:  L side Quality:  Aching and cramping Pain radiates to:  Does not radiate Pain severity:  Moderate Pain is:  Same all the time Onset quality:  Gradual Duration:  1 day Timing:  Intermittent Progression:  Worsening Chronicity:  New Context: lifting a heavy object   Relieved by:  Nothing Exacerbated by: movement. Ineffective treatments:  None tried Associated symptoms: no bladder incontinence, no bowel incontinence, no chest pain and no photophobia   Risk factors: no hx of head and neck radiation and no recent head injury     History reviewed. No pertinent past medical history. History reviewed. No pertinent past surgical history. Family History  Problem Relation Age of Onset  . Hypertension Mother   . Hypertension Father    History  Substance Use Topics  . Smoking status: Current Every Day Smoker -- 1.00 packs/day for 10 years    Types: Cigarettes  . Smokeless tobacco: Never Used  . Alcohol Use: 1.8 oz/week    3 Cans of beer per week     Comment: occasionally    Review of Systems  Constitutional: Negative for activity change.       All ROS Neg except as noted in HPI  HENT: Positive for neck pain. Negative for nosebleeds.   Eyes: Negative for photophobia and discharge.  Respiratory: Negative for cough, shortness of breath and wheezing.   Cardiovascular: Negative for chest pain and palpitations.  Gastrointestinal: Negative for abdominal pain, blood in stool and bowel incontinence.  Genitourinary: Negative for bladder incontinence, dysuria, frequency and hematuria.   Musculoskeletal: Negative for back pain and arthralgias.  Skin: Negative.   Neurological: Negative for dizziness, seizures and speech difficulty.  Psychiatric/Behavioral: Negative for hallucinations and confusion.    Allergies  Ibuprofen  Home Medications   Current Outpatient Rx  Name  Route  Sig  Dispense  Refill  . albuterol (PROVENTIL HFA;VENTOLIN HFA) 108 (90 BASE) MCG/ACT inhaler   Inhalation   Inhale 2 puffs into the lungs every 6 (six) hours as needed. Wheezing/Coughing/Shortness of Breath         . amoxicillin (AMOXIL) 250 MG capsule   Oral   Take 1 capsule (250 mg total) by mouth 3 (three) times daily.   21 capsule   0   . diphenhydramine-acetaminophen (TYLENOL PM) 25-500 MG TABS   Oral   Take 1 tablet by mouth at bedtime as needed.         Marland Kitchen HYDROcodone-acetaminophen (NORCO/VICODIN) 5-325 MG per tablet   Oral   Take 1 tablet by mouth every 4 (four) hours as needed.   20 tablet   0   . oxyCODONE-acetaminophen (PERCOCET/ROXICET) 5-325 MG per tablet   Oral   Take 2 tablets by mouth once as needed for pain.         . predniSONE (DELTASONE) 10 MG tablet      6, 5, 4, 3, 2 then 1 tablet by mouth daily for 6 days total.   21 tablet   0   . traMADol (ULTRAM) 50 MG tablet   Oral   Take 1 tablet (50 mg total)  by mouth every 6 (six) hours as needed for pain.   15 tablet   0    BP 109/87  Pulse 68  Temp(Src) 98 F (36.7 C) (Oral)  Resp 14  Ht 5\' 11"  (1.803 m)  Wt 250 lb (113.399 kg)  BMI 34.88 kg/m2  SpO2 100% Physical Exam  Nursing note and vitals reviewed. Constitutional: He is oriented to person, place, and time. He appears well-developed and well-nourished.  Non-toxic appearance.  HENT:  Head: Normocephalic.  Right Ear: Tympanic membrane and external ear normal.  Left Ear: Tympanic membrane and external ear normal.  Eyes: EOM and lids are normal. Pupils are equal, round, and reactive to light.  Neck: Normal range of motion. Neck supple.  Carotid bruit is not present.  Pain and stiffness with attempted ROM.  Cardiovascular: Normal rate, regular rhythm, normal heart sounds, intact distal pulses and normal pulses.   Pulmonary/Chest: Breath sounds normal. No respiratory distress.  Abdominal: Soft. Bowel sounds are normal. There is no tenderness. There is no guarding.  Musculoskeletal: Normal range of motion.  Left upper trapezius tenderness with ROM and palpation.  Lymphadenopathy:       Head (right side): No submandibular adenopathy present.       Head (left side): No submandibular adenopathy present.    He has no cervical adenopathy.  Neurological: He is alert and oriented to person, place, and time. He has normal strength. No cranial nerve deficit or sensory deficit. He exhibits normal muscle tone. Coordination normal.  Skin: Skin is warm and dry.  Psychiatric: He has a normal mood and affect. His speech is normal.    ED Course   Procedures (including critical care time)  Labs Reviewed - No data to display Dg Cervical Spine Complete  05/07/2013   *RADIOLOGY REPORT*  Clinical Data: Neck pain  CERVICAL SPINE - COMPLETE 4+ VIEW  Comparison: 07/20/2010  Findings: There is normal vertebral body stature alignment.  The disc spaces and neural foramen are well preserved.  The soft tissues are unremarkable.  There has been no change.  IMPRESSION: Normal cervical spine radiographs.   Original Report Authenticated By: Amie Portland, M.D.   No diagnosis found.  MDM  **I have reviewed nursing notes, vital signs, and all appropriate lab and imaging results for this patient.* Pt was lifting weight yesterday and injured the left neck and shoulder. No gross neuro changes present. Plan - use heating pad to affected area. Rx for robaxin, and tramadol.  Kathie Dike, PA-C 05/07/13 1328

## 2014-01-28 ENCOUNTER — Encounter (HOSPITAL_COMMUNITY): Payer: Self-pay | Admitting: Emergency Medicine

## 2014-01-28 ENCOUNTER — Emergency Department (HOSPITAL_COMMUNITY)
Admission: EM | Admit: 2014-01-28 | Discharge: 2014-01-28 | Disposition: A | Discharge status: Home / Self Care | Payer: Medicaid Other | Attending: Emergency Medicine | Admitting: Emergency Medicine

## 2014-01-28 DIAGNOSIS — Z792 Long term (current) use of antibiotics: Secondary | ICD-10-CM | POA: Insufficient documentation

## 2014-01-28 DIAGNOSIS — K089 Disorder of teeth and supporting structures, unspecified: Secondary | ICD-10-CM | POA: Insufficient documentation

## 2014-01-28 DIAGNOSIS — K0889 Other specified disorders of teeth and supporting structures: Secondary | ICD-10-CM

## 2014-01-28 DIAGNOSIS — F17200 Nicotine dependence, unspecified, uncomplicated: Secondary | ICD-10-CM | POA: Insufficient documentation

## 2014-01-28 DIAGNOSIS — IMO0002 Reserved for concepts with insufficient information to code with codable children: Secondary | ICD-10-CM | POA: Insufficient documentation

## 2014-01-28 MED ORDER — PENICILLIN V POTASSIUM 500 MG PO TABS: 500.0000 mg | ORAL_TABLET | Freq: Three times a day (TID) | ORAL | Status: DC

## 2014-01-28 MED ORDER — OXYCODONE HCL 5 MG PO TABS: 5.0000 mg | ORAL_TABLET | Freq: Four times a day (QID) | ORAL | Status: DC | PRN

## 2014-01-28 NOTE — ED Notes (Signed)
He states his R lower wisdom tooth is growing in and its very painful

## 2014-01-28 NOTE — ED Provider Notes (Signed)
CSN: [id number]     Arrival date & time 01/28/14  [id number] History   This chart was scribed for non-physician practitioner Lance ForthHannah Lynne Righi, PA-C, working with Hurman HornJohn M Bednar, MD, by Lance Adams, ED Scribe. This patient was seen in room TR07C/TR07C and the patient's care was started at 7:52 PM. First MD Initiated Contact with Patient 01/28/14 1937     Chief Complaint  Patient presents with  . Dental Pain    The history is provided by the patient. No language interpreter was used.    HPI Comments: Lance Adams is a 28 y.o. male who presents to the Emergency Department complaining of right, lower dental pain which has persisted for two days.   He also endorses pain to his right jaw. He has used Tylenol with codeine, which a friend gave him, without relief (but with itching). The pt denies otaliga,  fever, chills, nausea, or difficulty swallowing. In the ED, his temperature is 98.9 F. He had a tooth associated with the affected side pulled approximately 18 months ago. He had a dental cleaning eight months ago and was without dental problems at this time.  Pt with gold grill in place to the upper and lower teeth.   History reviewed. No pertinent past medical history. History reviewed. No pertinent past surgical history. Family History  Problem Relation Age of Onset  . Hypertension Mother   . Hypertension Father    History  Substance Use Topics  . Smoking status: Current Every Day Smoker -- 1.00 packs/day for 10 years    Types: Cigarettes  . Smokeless tobacco: Never Used  . Alcohol Use: 1.8 oz/week    3 Cans of beer per week     Comment: occasionally    Review of Systems  Constitutional: Negative for fever and chills.  HENT: Positive for dental problem. Negative for ear pain and trouble swallowing.   Gastrointestinal: Negative for nausea.  All other systems reviewed and are negative.   Allergies  Ibuprofen and Tylenol  Home Medications   Prior to Admission medications    Medication Sig Start Date End Date Taking? Authorizing Provider  albuterol (PROVENTIL HFA;VENTOLIN HFA) 108 (90 BASE) MCG/ACT inhaler Inhale 2 puffs into the lungs every 6 (six) hours as needed. Wheezing/Coughing/Shortness of Breath    Historical Provider, MD  amoxicillin (AMOXIL) 250 MG capsule Take 1 capsule (250 mg total) by mouth 3 (three) times daily. 03/11/13   Lance Orlene OchM Neese, NP  diphenhydramine-acetaminophen (TYLENOL PM) 25-500 MG TABS Take 1 tablet by mouth at bedtime as needed.    Historical Provider, MD  HYDROcodone-acetaminophen (NORCO/VICODIN) 5-325 MG per tablet Take 1 tablet by mouth every 4 (four) hours as needed. 03/11/13   Lance Orlene OchM Neese, NP  methocarbamol (ROBAXIN) 500 MG tablet 2 po tid for spasm pain 05/07/13   Lance DikeHobson M Bryant, PA-C  oxyCODONE-acetaminophen (PERCOCET/ROXICET) 5-325 MG per tablet Take 2 tablets by mouth once as needed for pain.    Historical Provider, MD  predniSONE (DELTASONE) 10 MG tablet 6, 5, 4, 3, 2 then 1 tablet by mouth daily for 6 days total. 11/05/12   Lance AmorJulie Idol, PA-C  traMADol (ULTRAM) 50 MG tablet Take 1 tablet (50 mg total) by mouth every 6 (six) hours as needed for pain. 11/05/12   Lance AmorJulie Idol, PA-C  traMADol Janean Sark(ULTRAM) 50 MG tablet 1 or 2 po q6h prn pain 05/07/13   Lance DikeHobson M Bryant, PA-C   Triage Vitals: BP 134/69  Pulse 84  Temp(Src) 98.9 F (37.2 C) (Oral)  Resp 16  Ht 5\' 11"  (1.803 m)  Wt 333 lb 8 oz (151.275 kg)  BMI 46.53 kg/m2  SpO2 96%  Physical Exam  Nursing note and vitals reviewed. Constitutional: He is oriented to person, place, and time. He appears well-developed and well-nourished. No distress.  HENT:  Head: Normocephalic and atraumatic.  Right Ear: Tympanic membrane, external ear and ear canal normal.  Left Ear: Tympanic membrane, external ear and ear canal normal.  Nose: Nose normal. Right sinus exhibits no maxillary sinus tenderness and no frontal sinus tenderness. Left sinus exhibits no maxillary sinus tenderness and no frontal  sinus tenderness.  Mouth/Throat: Uvula is midline, oropharynx is clear and moist and mucous membranes are normal. No oral lesions. Abnormal dentition. Dental caries present. No uvula swelling or lacerations. No oropharyngeal exudate, posterior oropharyngeal edema, posterior oropharyngeal erythema or tonsillar abscesses.  Pain to tooth number 32 on exam. Tooth 31 is surgically absent, which has allowed right lower wisdom tooth to erupt at an angle. He as non-erythematous, non-edematous gingiva. No evidence of abscess. Dental caries and dental work noticed throughout. No specific dental cary noted to the right lower wisdom tooth.   Eyes: Conjunctivae and EOM are normal. Pupils are equal, round, and reactive to light. Right eye exhibits no discharge. Left eye exhibits no discharge.  Neck: Normal range of motion. Neck supple. No tracheal deviation present.  Cardiovascular: Normal rate, regular rhythm and normal heart sounds.   Pulmonary/Chest: Effort normal and breath sounds normal. No respiratory distress. He has no wheezes.  Abdominal: Soft. Bowel sounds are normal. He exhibits no distension. There is no tenderness.  Musculoskeletal: Normal range of motion.  Lymphadenopathy:    He has no cervical adenopathy.  Neurological: He is alert and oriented to person, place, and time.  Skin: Skin is warm and dry.  Psychiatric: He has a normal mood and affect. His behavior is normal.    ED Course  Procedures (including critical care time)  DIAGNOSTIC STUDIES: Oxygen Saturation is 96% on room air, normal by my interpretation.    COORDINATION OF CARE:  7:56 PM- Discussed treatment plan with patient, and the patient agreed to the plan. The plan includes a prescription for penicillin and pain medication. Also will provide a dental referral and a work note.   Labs Review Labs Reviewed - No data to display  Imaging Review No results found.   EKG Interpretation None      MDM   Final diagnoses:   Pain, dental   Lance Adams presents with dental pain.  On exam no evidence of dental infection or dental caries to the right lower tooth in question however it does appear that her right lower wisdom tooth is erupting.   No gross abscess.  Exam unconcerning for Ludwig's angina or spread of infection.  Will treat with penicillin and pain medicine.  Urged patient to follow-up with dentist.    It has been determined that no acute conditions requiring further emergency intervention are present at this time. The patient/guardian have been advised of the diagnosis and plan. We have discussed signs and symptoms that warrant return to the ED, such as changes or worsening in symptoms.   Vital signs are stable at discharge.   BP 134/69  Pulse 84  Temp(Src) 98.9 F (37.2 C) (Oral)  Resp 16  Ht 5\' 11"  (1.803 m)  Wt 333 lb 8 oz (151.275 kg)  BMI 46.53 kg/m2  SpO2 96%  Patient/guardian has voiced understanding and agreed to follow-up  with the PCP or specialist.   I personally performed the services described in this documentation, which was scribed in my presence. The recorded information has been reviewed and is accurate.    Dahlia ClientHannah Quintessa Simmerman, PA-C 01/28/14 2011

## 2014-01-28 NOTE — ED Notes (Signed)
Pt states new onset R lower dental pain X 2days

## 2014-01-28 NOTE — Discharge Instructions (Signed)
1. Medications: vicodin, penicillin, usual home medications °2. Treatment: rest, drink plenty of fluids, take medications as prescribed °3. Follow Up: Please followup with your primary doctor for discussion of your diagnoses and further evaluation after today's visit; if you do not have a primary care doctor use the resource guide provided to find one; f/u with dentistry as discussed ° ° ° °Dental Pain °A tooth ache may be caused by cavities (tooth decay). Cavities expose the nerve of the tooth to air and hot or cold temperatures. It may come from an infection or abscess (also called a boil or furuncle) around your tooth. It is also often caused by dental caries (tooth decay). This causes the pain you are having. °DIAGNOSIS  °Your caregiver can diagnose this problem by exam. °TREATMENT  °· If caused by an infection, it may be treated with medications which kill germs (antibiotics) and pain medications as prescribed by your caregiver. Take medications as directed. °· Only take over-the-counter or prescription medicines for pain, discomfort, or fever as directed by your caregiver. °· Whether the tooth ache today is caused by infection or dental disease, you should see your dentist as soon as possible for further care. °SEEK MEDICAL CARE IF: °The exam and treatment you received today has been provided on an emergency basis only. This is not a substitute for complete medical or dental care. If your problem worsens or new problems (symptoms) appear, and you are unable to meet with your dentist, call or return to this location. °SEEK IMMEDIATE MEDICAL CARE IF:  °· You have a fever. °· You develop redness and swelling of your face, jaw, or neck. °· You are unable to open your mouth. °· You have severe pain uncontrolled by pain medicine. °MAKE SURE YOU:  °· Understand these instructions. °· Will watch your condition. °· Will get help right away if you are not doing well or get worse. °Document Released: 09/03/2005 Document  Revised: 11/26/2011 Document Reviewed: 04/21/2008 °ExitCare® Patient Information ©2014 [employer] LLC. ° ° ° °Emergency Department Resource Guide °1) Find a Doctor and Pay Out of Pocket °Although you won't have to find out who is covered by your insurance plan, it is a good idea to ask around and get recommendations. You will then need to call the office and see if the doctor you have chosen will accept you as a new patient and what types of options they offer for patients who are self-pay. Some doctors offer discounts or will set up payment plans for their patients who do not have insurance, but you will need to ask so you aren't surprised when you get to your appointment. ° °2) Contact Your Local Health Department °Not all health departments have doctors that can see patients for sick visits, but many do, so it is worth a call to see if yours does. If you don't know where your local health department is, you can check in your phone book. The CDC also has a tool to help you locate your state's health department, and many state websites also have listings of all of their local health departments. ° °3) Find a Walk-in Clinic °If your illness is not likely to be very severe or complicated, you may want to try a walk in clinic. These are popping up all over the country in pharmacies, drugstores, and shopping centers. They're usually staffed by nurse practitioners or physician assistants that have been trained to treat common illnesses and complaints. They're usually fairly quick and inexpensive. However, if   you have serious medical issues or chronic medical problems, these are probably not your best option.  No Primary Care Doctor: - Call Health Connect at  [phone] - they can help you locate a primary care doctor that  accepts your insurance, provides certain services, etc. - Physician Referral Service- [phone]  Chronic Pain Problems: Organization         Address  Phone   Notes  [facility]  [phone] Patients need to be referred by their primary care doctor.   Medication Assistance: Organization         Address  Phone   Notes  [facility] Medication [facility] [address] [place], [place] [address] [phone] --Must be a resident of [facility] -- Must have NO insurance coverage whatsoever (no Medicaid/ Medicare, etc.) -- The pt. MUST have a primary care doctor that directs their care regularly and follows them in the community   [facility]ist  [phone]   [employer]  [phone]    Agencies that provide inexpensive medical care: Organization         Address  Phone   Notes  [place]  [phone]   [facility]    [phone]   [facility] [place], [place] [address] [phone]   [place] [address] [place] [phone]   Planned Parenthood    [phone]   [facility]    [phone]   [place] and [place] [address], [place] Phone:  [phone], Fax:  [phone] Hours of Operation:  9 am - 6 pm, M-F.  Also accepts Medicaid/Medicare and self-pay.  [facility] for [address] [place] [address], [address], [place] Phone: [phone], Fax: [phone]. Hours of Operation:  8:30 am - 5:30 pm, M-F.  Also accepts Medicaid and self-pay.  [facility] [place] [address], [place] Phone: [phone]   [place], [place], [place] [phone], Ext. [phone] Mondays & Thursdays: 7-9 AM.  First 15 patients are seen on a first come, first serve basis.    [place] Providers:  Organization         Address  Phone   Notes  [facility] [address],  [phone] Also accepts self-pay patients.  [facility] [address], [place]  [phone]   [place], [address], [place] [phone]   [facility] [address] [place] [phone]   Lucianne Lei [address], [place]   [phone] Only accepts [place] Access [place] patients after they have their name applied to their card.   Self-Pay (no insurance) in [facility]:  Organization         Address  Phone   Notes  Sickle Cell Patients, [facility] Internal Medicine [place] [phone]   [facility] [place] [phone]   [facility] [place]  [place], [place], [place] [phone]   [facility]/Dr. Osei-Bonsu  [address], [place] or [address], [place] [phone] Phone number for both [facility] and  [place] locations is the same.  Urgent Medical and [facility] [address] [place] [phone]   [facility] [address] [place] or [address] [phone]   [facility] [address] [place] [phone], phone; [phone], fax Sees patients 1st and 3rd Saturday of every month.  Must not qualify for public or private insurance (i.e. Medicaid, Medicare, [place] Health Choice, Veterans' Benefits)  Household income should be no more than 200% of the poverty level The clinic cannot treat you if you are pregnant or think you are pregnant  Sexually transmitted diseases are not treated at the clinic.    Dental Care: Organization         Address  Phone  Notes  [facility] Department of [facility] [phone] Accepts children up to age 34 who are enrolled in [place] or [place]; pregnant women with a Medicaid card; and children who have applied for Medicaid or [place] Health  Choice, but were declined, whose parents can pay a reduced fee at time of service.  [facility] Department of [facility]  [address], [place] [phone] Accepts children up to age 60 who are enrolled in [place] or [place]; pregnant women with a Medicaid card; and children who have applied for Medicaid or  Health Choice, but were declined, whose parents can pay a reduced fee at time of service.  [place] Adult Dental Access PROGRAM  [place] [phone] Patients are seen by appointment only. Walk-ins are not accepted. [place] will see patients 35 years of age and older. Monday - Tuesday (8am-5pm) Most Wednesdays (8:30-5pm) $30 per visit, cash only  [facility] Adult Dental Access PROGRAM  [address], [facility] [phone] Patients are seen by appointment only. Walk-ins are not accepted. [place] will see patients 66 years of age and older. One Wednesday Evening (Monthly: Volunteer Based).  $30 per visit, cash only  [place]  [phone] for adults; Children under age 61, call Graduate Pediatric Dentistry at [phone]. Children aged 65-14, please call [phone] to request a pediatric application.  Dental services are provided in all areas of dental care including fillings, crowns and bridges, complete and partial dentures, implants, gum treatment, root canals, and extractions. Preventive care is also provided. Treatment is provided to both adults and children. Patients are selected via a lottery and there is often a waiting list.   [facility] [address] [place]  [phone] [email or web address]   [facility] [address] [employer] [place], [place] [phone], Ext. [phone] Second and Fourth Thursday of each month, opens at 6:30 AM; Clinic ends at 9 AM.  Patients are seen on a first-come first-served basis, and a limited number are seen during each  clinic.   [facility]  [address] Hillard Danker [place], [place] [phone]   Eligibility Requirements You must have lived in [place], [place], or [place] counties for at least the last three months.   You cannot be eligible for state or federal sponsored [employer], including [employer], [place], or [employer].   You generally cannot be eligible for healthcare insurance through your employer.    How to apply: Eligibility screenings are held every Tuesday and Wednesday afternoon from 1:00 pm until 4:00 pm. You do not  need an appointment for the interview!  [facility] [address], [place], [place]   [place]  [place] Department  [place]  [phone]    Behavioral Health Resources in the Community: Intensive Outpatient Programs Organization         Address  Phone  Notes  [place]. [address], [place], [place] [phone]   [facility] Outpatient [address] [place], [place] [address]   ADS: Alcohol & Drug Svcs [address], [place], [place]   [place] [address]  [place], [place] or [phone]   Substance Abuse Resources Organization         Address  Phone  Notes  Alcohol and Drug Services  [phone]   [facility]  [phone]   [facility] [place]   Chinita Pester  [phone]   Residential & Outpatient Substance Abuse Program  [phone]   Psychological Services Organization         Address  Phone  Notes  [facility] [place]  [place]  [phone]   [place] [address], [place] or [phone]    Mobile Crisis Teams Organization         Address  Phone  Notes  [facility], Mobile  Crisis Care Unit  [phone]   [facility]  [address] [place], [place]   [place] [address] [place] [phone]    Self-Help/Support Groups Organization         Address  Phone             Notes  [place]. of [place] - variety of support groups  [place] Call for more information  Narcotics Anonymous (NA), Caring [facility] [address], [employer] [place]  2 meetings at this location   Special educational needs teacher         Address  Phone  Notes  [facility] [place],    [place]  [phone]   [facility]  [address] [place] [phone], [place], [place]   [place], [place] [phone] Admissions: 8am-3pm M-F  Incentives Substance [place] [address],    [place], [place] [phone]   [facility] [address] [place], [place], [place]   [facility] [address]  [place], [place]   Insight Programs - Intensive Outpatient [address] Kristeen Mans [address], [place], [place]   [facility] ([place].,  [place], [place] [phone] or [phone]   [facility]idential Treatment Services (RTS) [address], [place], [place] Accepts Medicaid  [facility] [place] [address]  [place] [phone] Substance Abuse/Addiction Treatment   [facility] Organization         Address  Phone  Notes  [facility]  [phone]   Domenic Schwab, PhD [address] Arlis Porta [place], [place]   [phone] or [phone]   [place], [place] [phone]   [place] [address], [place], [place] [phone] Insurance/Medicaid/sponsorship through [employer] and Families [address], [place]  [address]  [place], [place] [phone] [place], [place] [phone]    Dr. Adele Schilder  [phone]   [facility] Dept. 1) [address] [place] 2) [place] 3)  [place], [place] [phone]  [phone]   [place] [phone] or [phone] (After Hours)

## 2014-01-29 NOTE — ED Provider Notes (Signed)
Medical screening examination/treatment/procedure(s) were performed by non-physician practitioner and as supervising physician I was immediately available for consultation/collaboration.   EKG Interpretation None       Lance HornJohn M Jearlean Demauro, MD 01/29/14 2145

## 2014-05-11 ENCOUNTER — Encounter (HOSPITAL_COMMUNITY): Payer: Self-pay | Admitting: Emergency Medicine

## 2014-05-11 ENCOUNTER — Emergency Department (HOSPITAL_COMMUNITY)
Admission: EM | Admit: 2014-05-11 | Discharge: 2014-05-11 | Disposition: A | Discharge status: Home / Self Care | Payer: Medicaid Other | Attending: Family Medicine | Admitting: Family Medicine

## 2014-05-11 DIAGNOSIS — K0889 Other specified disorders of teeth and supporting structures: Secondary | ICD-10-CM

## 2014-05-11 DIAGNOSIS — F17200 Nicotine dependence, unspecified, uncomplicated: Secondary | ICD-10-CM | POA: Diagnosis not present

## 2014-05-11 DIAGNOSIS — K029 Dental caries, unspecified: Secondary | ICD-10-CM | POA: Insufficient documentation

## 2014-05-11 DIAGNOSIS — K0530 Chronic periodontitis, unspecified: Secondary | ICD-10-CM | POA: Diagnosis not present

## 2014-05-11 DIAGNOSIS — IMO0002 Reserved for concepts with insufficient information to code with codable children: Secondary | ICD-10-CM | POA: Diagnosis not present

## 2014-05-11 DIAGNOSIS — R51 Headache: Secondary | ICD-10-CM | POA: Insufficient documentation

## 2014-05-11 DIAGNOSIS — Z792 Long term (current) use of antibiotics: Secondary | ICD-10-CM | POA: Insufficient documentation

## 2014-05-11 DIAGNOSIS — Z79899 Other long term (current) drug therapy: Secondary | ICD-10-CM | POA: Diagnosis not present

## 2014-05-11 DIAGNOSIS — K089 Disorder of teeth and supporting structures, unspecified: Secondary | ICD-10-CM | POA: Diagnosis present

## 2014-05-11 MED ORDER — TRAMADOL HCL 50 MG PO TABS: ORAL_TABLET | ORAL | Status: DC

## 2014-05-11 MED ORDER — CHLORHEXIDINE GLUCONATE 0.12 % MT SOLN: 15.0000 mL | Freq: Two times a day (BID) | OROMUCOSAL | Status: DC

## 2014-05-11 MED ORDER — CLINDAMYCIN HCL 300 MG PO CAPS: 300.0000 mg | ORAL_CAPSULE | Freq: Three times a day (TID) | ORAL | Status: DC

## 2014-05-11 NOTE — Discharge Instructions (Signed)
You have a dental infection  Please start the antibiotics Please use the tramadol for pain Please follow oup with the dentist

## 2014-05-11 NOTE — ED Provider Notes (Signed)
CSN: [id number]     Arrival date & time 05/11/14  1642 History   First MD Initiated Contact with Patient 05/11/14 1758     Chief Complaint  Patient presents with  . Dental Pain     (Consider location/radiation/quality/duration/timing/severity/associated sxs/prior Treatment) Patient is a 28 y.o. male presenting with tooth pain. The history is provided by the patient.  Dental Pain Location:  Lower Lower teeth location:  32/RL 3rd molar Quality:  Aching Severity:  Moderate Onset quality:  Gradual Duration: suddenly worsened ofer the las couple of days.  Timing:  Constant Progression:  Worsening Chronicity:  Recurrent Context: dental caries and poor dentition   Relieved by:  Heat and NSAIDs Worsened by:  Cold food/drink and hot food/drink Ineffective treatments:  Topical anesthetic gel Associated symptoms: headaches   Associated symptoms: no difficulty swallowing, no drooling, no facial swelling, no gum swelling, no oral lesions and no trismus   Risk factors: periodontal disease    Now w/ L 2nd molar w/ pain. Acute on set yesterday.    History reviewed. No pertinent past medical history. History reviewed. No pertinent past surgical history. Family History  Problem Relation Age of Onset  . Hypertension Mother   . Hypertension Father    History  Substance Use Topics  . Smoking status: Current Every Day Smoker -- 1.00 packs/day for 10 years    Types: Cigarettes  . Smokeless tobacco: Never Used  . Alcohol Use: 1.8 oz/week    3 Cans of beer per week     Comment: occasionally    Review of Systems  HENT: Positive for dental problem. Negative for drooling, facial swelling and mouth sores.   Cardiovascular: Negative for chest pain.  Skin: Negative for rash.  Neurological: Positive for headaches.  All other systems reviewed and are negative.   Allergies  Ibuprofen and Tylenol  Home Medications   Prior to Admission medications   Medication Sig Start Date End Date  Taking? Authorizing Provider  albuterol (PROVENTIL HFA;VENTOLIN HFA) 108 (90 BASE) MCG/ACT inhaler Inhale 2 puffs into the lungs every 6 (six) hours as needed. Wheezing/Coughing/Shortness of Breath    Historical Provider, MD  amoxicillin (AMOXIL) 250 MG capsule Take 1 capsule (250 mg total) by mouth 3 (three) times daily. 03/11/13   Hope Orlene Och, NP  clindamycin (CLEOCIN) 300 MG capsule Take 1 capsule (300 mg total) by mouth 3 (three) times daily. 05/11/14   Ozella Rocks, MD  diphenhydramine-acetaminophen (TYLENOL PM) 25-500 MG TABS Take 1 tablet by mouth at bedtime as needed.    Historical Provider, MD  HYDROcodone-acetaminophen (NORCO/VICODIN) 5-325 MG per tablet Take 1 tablet by mouth every 4 (four) hours as needed. 03/11/13   Hope Orlene Och, NP  methocarbamol (ROBAXIN) 500 MG tablet 2 po tid for spasm pain 05/07/13   Kathie Dike, PA-C  oxyCODONE (ROXICODONE) 5 MG immediate release tablet Take 1 tablet (5 mg total) by mouth every 6 (six) hours as needed for severe pain. 01/28/14   Hannah Muthersbaugh, PA-C  oxyCODONE-acetaminophen (PERCOCET/ROXICET) 5-325 MG per tablet Take 2 tablets by mouth once as needed for pain.    Historical Provider, MD  penicillin v potassium (VEETID) 500 MG tablet Take 1 tablet (500 mg total) by mouth 3 (three) times daily. 01/28/14   Hannah Muthersbaugh, PA-C  predniSONE (DELTASONE) 10 MG tablet 6, 5, 4, 3, 2 then 1 tablet by mouth daily for 6 days total. 11/05/12   Burgess Amor, PA-C  traMADol (ULTRAM) 50 MG tablet Take 1  tablet (50 mg total) by mouth every 6 (six) hours as needed for pain. 11/05/12   Burgess Amor, PA-C  traMADol (ULTRAM) 50 MG tablet 1 or 2 po q6h prn pain 05/11/14   Ozella Rocks, MD   BP 148/75  Pulse 87  Temp(Src) 98 F (36.7 C) (Oral)  Resp 18  Ht  (1.803 m)  Wt 275 lb (124.739 kg)  BMI 38.37 kg/m2  SpO2 98% Physical Exam  Constitutional: He is oriented to person, place, and time. He appears well-developed and well-nourished. No distress.   HENT:  bilat mandibular wisdom teeth growing in on a near 90 degree angle. Numerous dnetal carries present and gums erythemaous an ttp. No purulence or discharge.   Eyes: Pupils are equal, round, and reactive to light.  Neck: Normal range of motion.  Cardiovascular: Normal rate.   Pulmonary/Chest: Effort normal. No respiratory distress.  Abdominal: Soft.  Musculoskeletal: Normal range of motion. He exhibits no edema and no tenderness.  Neurological: He is alert and oriented to person, place, and time. No cranial nerve deficit. Coordination normal.  Skin: Skin is warm. He is not diaphoretic.  Psychiatric: He has a normal mood and affect. His behavior is normal. Judgment and thought content normal.    ED Course  Procedures (including critical care time) Labs Review Labs Reviewed - No data to display  Imaging Review No results found.   EKG Interpretation None      MDM   Final diagnoses:  Dentalgia  Periodontitis   Start [place]inda Tramadol PRN pain chlorhedxidine F/u dentist Precautions given and all questions answered  Shelly Flatten, MD Family Medicine 05/11/2014, 6:25 PM      Ozella Rocks, MD 05/11/14 [phone]

## 2014-05-11 NOTE — ED Notes (Signed)
Pt in c/o bilateral toothache, states he knows he needs to have two back teeth removed but pain has increase today and he is unable to see a dentist

## 2014-07-02 ENCOUNTER — Emergency Department (HOSPITAL_COMMUNITY)
Admission: EM | Admit: 2014-07-02 | Discharge: 2014-07-02 | Disposition: A | Discharge status: Home / Self Care | Payer: Medicaid Other | Attending: Emergency Medicine | Admitting: Emergency Medicine

## 2014-07-02 ENCOUNTER — Encounter (HOSPITAL_COMMUNITY): Payer: Self-pay | Admitting: Emergency Medicine

## 2014-07-02 DIAGNOSIS — Z720 Tobacco use: Secondary | ICD-10-CM | POA: Insufficient documentation

## 2014-07-02 DIAGNOSIS — K0889 Other specified disorders of teeth and supporting structures: Secondary | ICD-10-CM

## 2014-07-02 DIAGNOSIS — Z792 Long term (current) use of antibiotics: Secondary | ICD-10-CM | POA: Diagnosis not present

## 2014-07-02 DIAGNOSIS — Z7952 Long term (current) use of systemic steroids: Secondary | ICD-10-CM | POA: Insufficient documentation

## 2014-07-02 DIAGNOSIS — K088 Other specified disorders of teeth and supporting structures: Secondary | ICD-10-CM | POA: Insufficient documentation

## 2014-07-02 DIAGNOSIS — Z79899 Other long term (current) drug therapy: Secondary | ICD-10-CM | POA: Insufficient documentation

## 2014-07-02 MED ORDER — DIPHENHYDRAMINE HCL 25 MG PO TABS: 25.0000 mg | ORAL_TABLET | Freq: Four times a day (QID) | ORAL | Status: DC

## 2014-07-02 MED ORDER — HYDROCODONE-ACETAMINOPHEN 5-325 MG PO TABS: 2.0000 | ORAL_TABLET | ORAL | Status: DC | PRN

## 2014-07-02 NOTE — Discharge Instructions (Signed)
Dental Pain °A tooth ache may be caused by cavities (tooth decay). Cavities expose the nerve of the tooth to air and hot or cold temperatures. It may come from an infection or abscess (also called a boil or furuncle) around your tooth. It is also often caused by dental caries (tooth decay). This causes the pain you are having. °DIAGNOSIS  °Your caregiver can diagnose this problem by exam. °TREATMENT  °· If caused by an infection, it may be treated with medications which kill germs (antibiotics) and pain medications as prescribed by your caregiver. Take medications as directed. °· Only take over-the-counter or prescription medicines for pain, discomfort, or fever as directed by your caregiver. °· Whether the tooth ache today is caused by infection or dental disease, you should see your dentist as soon as possible for further care. °SEEK MEDICAL CARE IF: °The exam and treatment you received today has been provided on an emergency basis only. This is not a substitute for complete medical or dental care. If your problem worsens or new problems (symptoms) appear, and you are unable to meet with your dentist, call or return to this location. °SEEK IMMEDIATE MEDICAL CARE IF:  °· You have a fever. °· You develop redness and swelling of your face, jaw, or neck. °· You are unable to open your mouth. °· You have severe pain uncontrolled by pain medicine. °MAKE SURE YOU:  °· Understand these instructions. °· Will watch your condition. °· Will get help right away if you are not doing well or get worse. °Document Released: 09/03/2005 Document Revised: 11/26/2011 Document Reviewed: 04/21/2008 °Exit[employer]® Patient Information ©2015 [employer]. This information is not intended to replace advice given to you by your health care provider. Make sure you discuss any questions you have with your health care provider. ° °Dental Care and Dentist Visits °Dental care supports good overall health. Regular dental visits can also help you  avoid dental pain, bleeding, infection, and other more serious health problems in the future. It is important to keep the mouth healthy because diseases in the teeth, gums, and other oral tissues can spread to other areas of the body. Some problems, such as diabetes, heart disease, and pre-term labor have been associated with poor oral health.  °See your dentist every 6 months. If you experience emergency problems such as a toothache or broken tooth, go to the dentist right away. If you see your dentist regularly, you may catch problems early. It is easier to be treated for problems in the early stages.  °WHAT TO EXPECT AT A DENTIST VISIT  °Your dentist will look for many common oral health problems and recommend proper treatment. At your regular dental visit, you can expect: °· Gentle cleaning of the teeth and gums. This includes scraping and polishing. This helps to remove the sticky substance around the teeth and gums (plaque). Plaque forms in the mouth shortly after eating. Over time, plaque hardens on the teeth as tartar. If tartar is not removed regularly, it can cause problems. Cleaning also helps remove stains. °· Periodic X-rays. These pictures of the teeth and supporting bone will help your dentist assess the health of your teeth. °· Periodic fluoride treatments. Fluoride is a natural mineral shown to help strengthen teeth. Fluoride treatment involves applying a fluoride gel or varnish to the teeth. It is most commonly done in children. °· Examination of the mouth, tongue, jaws, teeth, and gums to look for any oral health problems, such as: °¨ Cavities (dental caries). This is   decay on the tooth caused by plaque, sugar, and acid in the mouth. It is best to catch a cavity when it is small. °¨ Inflammation of the gums caused by plaque buildup (gingivitis). °¨ Problems with the mouth or malformed or misaligned teeth. °¨ Oral cancer or other diseases of the soft tissues or jaws.  °KEEP YOUR TEETH AND GUMS  HEALTHY °For healthy teeth and gums, follow these general guidelines as well as your dentist's specific advice: °· Have your teeth professionally cleaned at the dentist every 6 months. °· Brush twice daily with a fluoride toothpaste. °· Floss your teeth daily.  °· Ask your dentist if you need fluoride supplements, treatments, or fluoride toothpaste. °· Eat a healthy diet. Reduce foods and drinks with added sugar. °· Avoid smoking. °TREATMENT FOR ORAL HEALTH PROBLEMS °If you have oral health problems, treatment varies depending on the conditions present in your teeth and gums. °· Your caregiver will most likely recommend good oral hygiene at each visit. °· For cavities, gingivitis, or other oral health disease, your caregiver will perform a procedure to treat the problem. This is typically done at a separate appointment. Sometimes your caregiver will refer you to another dental specialist for specific tooth problems or for surgery. °SEEK IMMEDIATE DENTAL CARE IF: °· You have pain, bleeding, or soreness in the gum, tooth, jaw, or mouth area. °· A permanent tooth becomes loose or separated from the gum socket. °· You experience a blow or injury to the mouth or jaw area. °Document Released: 05/16/2011 Document Revised: 11/26/2011 Document Reviewed: 05/16/2011 °ExitCare® Patient Information ©2015 [employer]. This information is not intended to replace advice given to you by your health care provider. Make sure you discuss any questions you have with your health care provider. ° °

## 2014-07-02 NOTE — ED Provider Notes (Signed)
CSN: [id number]     Arrival date & time 07/02/14  1332 History   None    Chief Complaint  Patient presents with  . Dental Pain   The history is provided by the patient. No language interpreter was used.   This chart was scribed for non-physician practitioner, Mayme GentaBen Sathvika Ojo PA-C working with Elwin MochaBlair Walden, MD, by Andrew Auaven Small, ED Scribe. This patient was seen in room WTR8/WTR8 and the patient's care was started at 2:26 PM.  Lance Adams is a 28 y.o. male who presents to the Emergency Department complaining of left lower dental pain. Pt has had this pain for a while but recently worsened 1 day ago. He reports worsening pain with eating and drink. Pt has taken tramadol without relief to pain. He reports medication gave him a HA. Pt denies hx wisdom teeth extraction. He denies PCP and dentist. Pt denies allergies. He denied PMH.   History reviewed. No pertinent past medical history. History reviewed. No pertinent past surgical history. Family History  Problem Relation Age of Onset  . Hypertension Mother   . Hypertension Father    History  Substance Use Topics  . Smoking status: Current Every Day Smoker -- 1.00 packs/day for 10 years    Types: Cigarettes  . Smokeless tobacco: Never Used  . Alcohol Use: 1.8 oz/week    3 Cans of beer per week     Comment: occasionally    Review of Systems  Constitutional: Negative for fever and chills.  HENT: Positive for dental problem.   All other systems reviewed and are negative.   Allergies  Ibuprofen and Tylenol  Home Medications   Prior to Admission medications   Medication Sig Start Date End Date Taking? Authorizing Provider  albuterol (PROVENTIL HFA;VENTOLIN HFA) 108 (90 BASE) MCG/ACT inhaler Inhale 2 puffs into the lungs every 6 (six) hours as needed. Wheezing/Coughing/Shortness of Breath   Yes Historical Provider, MD  diphenhydrAMINE (BENADRYL) 25 MG tablet Take 1 tablet (25 mg total) by mouth every 6 (six) hours. 07/02/14   Sharlene MottsBenjamin W  Bartow Zylstra, PA-C  HYDROcodone-acetaminophen (NORCO) 5-325 MG per tablet Take 2 tablets by mouth every 4 (four) hours as needed. 07/02/14   Earle GellBenjamin W Exa Bomba, PA-C  penicillin v potassium (VEETID) 500 MG tablet Take 1 tablet (500 mg total) by mouth 3 (three) times daily. 01/28/14   Hannah Muthersbaugh, PA-C  predniSONE (DELTASONE) 10 MG tablet 6, 5, 4, 3, 2 then 1 tablet by mouth daily for 6 days total. 11/05/12   Burgess AmorJulie Idol, PA-C  traMADol (ULTRAM) 50 MG tablet Take 1 tablet (50 mg total) by mouth every 6 (six) hours as needed for pain. 11/05/12   Burgess AmorJulie Idol, PA-C  traMADol (ULTRAM) 50 MG tablet 1 or 2 po q6h prn pain 05/11/14   Ozella Rocksavid J Merrell, MD   BP 153/95  Pulse 68  Temp(Src) 98.2 F (36.8 C) (Oral)  Resp 16  SpO2 100% Physical Exam  Nursing note and vitals reviewed. Constitutional: He is oriented to person, place, and time. He appears well-developed and well-nourished. No distress.  HENT:  Head: Normocephalic and atraumatic.  Mouth/Throat:    No erythema or gingival swelling appreciated. No fluctuance or sign of abscess. No tonsillar swelling, uvula midline. Handling secretions well. Patent airway. Both molars on the lower mandible appear to be rotated forward anteriorly. No evidence of drainable abscess.  Eyes: Conjunctivae and EOM are normal.  Neck: Neck supple.  Cardiovascular: Normal rate, regular rhythm and normal heart sounds.  Exam  reveals no gallop and no friction rub.   No murmur heard. Pulmonary/Chest: Effort normal and breath sounds normal. No respiratory distress. He has no wheezes. He has no rales. He exhibits no tenderness.  Musculoskeletal: Normal range of motion.  Neurological: He is alert and oriented to person, place, and time.  Skin: Skin is warm and dry.  Psychiatric: He has a normal mood and affect. His behavior is normal.    ED Course  Procedures (including critical care time) DIAGNOSTIC STUDIES: Oxygen Saturation is 100% on RA, normal by my interpretation.     COORDINATION OF CARE: 11:17 PM- Pt advised of plan for treatment which include pain medication and pt agrees. Pt has been advised to do warm salt water gargling. Pt will receive a referral to a dentist.   Labs Review Labs Reviewed - No data to display  Imaging Review No results found.   EKG Interpretation None     Meds given in ED:  Medications - No data to display  Discharge Medication List as of 07/02/2014  2:49 PM    START taking these medications   Details  diphenhydrAMINE (BENADRYL) 25 MG tablet Take 1 tablet (25 mg total) by mouth every 6 (six) hours., Starting 07/02/2014, Until Discontinued, Print    !! HYDROcodone-acetaminophen (NORCO) 5-325 MG per tablet Take 2 tablets by mouth every 4 (four) hours as needed., Starting 07/02/2014, Until Discontinued, Print     !! - Potential duplicate medications found. Please discuss with provider.      MDM  Vitals stable  -afebrile, Discussed primary care eval of BP. Pt resting comfortably in ED.  PE shows no evidence of active infection or acute emergent pathology. Low suspicion for Ludwig angina. No evidence of PTA. Pt tolerating PO, handling secretions. Patent airway with no respiratory difficulty. Will DC with Norco for pain management. Referral for dentistry provided. Discussed f/u with PCP and return precautions, pt very amenable to plan.   Final diagnoses:  Tooth pain    I personally performed the services described in this documentation, which was scribed in my presence. The recorded information has been reviewed and is accurate.      Sharlene MottsBenjamin W Ayo Smoak, PA-C 07/02/14 2320

## 2014-07-02 NOTE — ED Notes (Signed)
Pt reports dental pain in R lower molar for past few weeks. Pt trying to get medicaid to have tooth pulled, but still working on it. R lower back molar partially missing

## 2014-07-06 NOTE — ED Provider Notes (Signed)
Medical screening examination/treatment/procedure(s) were performed by non-physician practitioner and as supervising physician I was immediately available for consultation/collaboration.   EKG Interpretation None        Aeric Burnham, MD 07/06/14 0107 

## 2014-11-12 ENCOUNTER — Encounter (HOSPITAL_COMMUNITY): Payer: Self-pay | Admitting: Emergency Medicine

## 2014-11-12 ENCOUNTER — Emergency Department (HOSPITAL_COMMUNITY)
Admission: EM | Admit: 2014-11-12 | Discharge: 2014-11-12 | Disposition: A | Discharge status: Home / Self Care | Payer: Medicaid Other | Attending: Emergency Medicine | Admitting: Emergency Medicine

## 2014-11-12 ENCOUNTER — Emergency Department (HOSPITAL_COMMUNITY): Payer: Medicaid Other

## 2014-11-12 DIAGNOSIS — S39012A Strain of muscle, fascia and tendon of lower back, initial encounter: Secondary | ICD-10-CM | POA: Diagnosis not present

## 2014-11-12 DIAGNOSIS — Z79899 Other long term (current) drug therapy: Secondary | ICD-10-CM | POA: Diagnosis not present

## 2014-11-12 DIAGNOSIS — Y9389 Activity, other specified: Secondary | ICD-10-CM | POA: Diagnosis not present

## 2014-11-12 DIAGNOSIS — Z720 Tobacco use: Secondary | ICD-10-CM | POA: Diagnosis not present

## 2014-11-12 DIAGNOSIS — Z792 Long term (current) use of antibiotics: Secondary | ICD-10-CM | POA: Insufficient documentation

## 2014-11-12 DIAGNOSIS — Y92410 Unspecified street and highway as the place of occurrence of the external cause: Secondary | ICD-10-CM | POA: Diagnosis not present

## 2014-11-12 DIAGNOSIS — S29019A Strain of muscle and tendon of unspecified wall of thorax, initial encounter: Secondary | ICD-10-CM

## 2014-11-12 DIAGNOSIS — S199XXA Unspecified injury of neck, initial encounter: Secondary | ICD-10-CM | POA: Diagnosis not present

## 2014-11-12 DIAGNOSIS — S3992XA Unspecified injury of lower back, initial encounter: Secondary | ICD-10-CM | POA: Diagnosis present

## 2014-11-12 DIAGNOSIS — Y998 Other external cause status: Secondary | ICD-10-CM | POA: Diagnosis not present

## 2014-11-12 DIAGNOSIS — S29012A Strain of muscle and tendon of back wall of thorax, initial encounter: Secondary | ICD-10-CM | POA: Insufficient documentation

## 2014-11-12 MED ORDER — CYCLOBENZAPRINE HCL 5 MG PO TABS: 5.0000 mg | ORAL_TABLET | Freq: Three times a day (TID) | ORAL | Status: DC | PRN

## 2014-11-12 MED ORDER — MELOXICAM 7.5 MG PO TABS: 7.5000 mg | ORAL_TABLET | Freq: Every day | ORAL | Status: DC

## 2014-11-12 NOTE — ED Notes (Signed)
Patient with no complaints at this time. Respirations even and unlabored. Skin warm/dry. Discharge instructions reviewed with patient at this time. Patient given opportunity to voice concerns/ask questions. Patient discharged at this time and left Emergency Department with steady gait.   
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
## 2014-11-12 NOTE — Discharge Instructions (Signed)
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
## 2014-12-15 ENCOUNTER — Emergency Department (HOSPITAL_COMMUNITY)
Admission: EM | Admit: 2014-12-15 | Discharge: 2014-12-15 | Disposition: A | Discharge status: Home / Self Care | Payer: Medicaid Other | Attending: Emergency Medicine | Admitting: Emergency Medicine

## 2014-12-15 ENCOUNTER — Encounter (HOSPITAL_COMMUNITY): Payer: Self-pay | Admitting: *Deleted

## 2014-12-15 DIAGNOSIS — M722 Plantar fascial fibromatosis: Secondary | ICD-10-CM | POA: Diagnosis not present

## 2014-12-15 DIAGNOSIS — Z791 Long term (current) use of non-steroidal anti-inflammatories (NSAID): Secondary | ICD-10-CM | POA: Insufficient documentation

## 2014-12-15 DIAGNOSIS — Z720 Tobacco use: Secondary | ICD-10-CM | POA: Diagnosis not present

## 2014-12-15 DIAGNOSIS — G8929 Other chronic pain: Secondary | ICD-10-CM | POA: Diagnosis not present

## 2014-12-15 DIAGNOSIS — M79672 Pain in left foot: Secondary | ICD-10-CM

## 2014-12-15 DIAGNOSIS — Z7952 Long term (current) use of systemic steroids: Secondary | ICD-10-CM | POA: Insufficient documentation

## 2014-12-15 DIAGNOSIS — M79671 Pain in right foot: Secondary | ICD-10-CM

## 2014-12-15 DIAGNOSIS — Z792 Long term (current) use of antibiotics: Secondary | ICD-10-CM | POA: Insufficient documentation

## 2014-12-15 MED ORDER — NAPROXEN 500 MG PO TABS: 500.0000 mg | ORAL_TABLET | Freq: Two times a day (BID) | ORAL | Status: DC

## 2014-12-15 NOTE — ED Notes (Addendum)
Pt reports chronic feet pain.  Pt states he is flat footed and has required surgery when he was younger.  States he was supposed to go back for a second surgery but never went back.  Pt reports he works on his feet all day wearing steel toe shoes. Pt reports pain radiates up to his ankles.    Pt reports taking his wife's hydrocodone x 2 last night-did not have itching after taking them.

## 2014-12-15 NOTE — Discharge Instructions (Signed)
Take naproxen as directed. You may roll a tennis or lacrosse ball under foot to stretch.   Plantar Fasciitis Plantar fasciitis is a common condition that causes foot pain. It is soreness (inflammation) of the band of tough fibrous tissue on the bottom of the foot that runs from the heel bone (calcaneus) to the ball of the foot. The cause of this soreness may be from excessive standing, poor fitting shoes, running on hard surfaces, being overweight, having an abnormal walk, or overuse (this is common in runners) of the painful foot or feet. It is also common in aerobic exercise dancers and ballet dancers. SYMPTOMS  Most people with plantar fasciitis complain of:  Severe pain in the morning on the bottom of their foot especially when taking the first steps out of bed. This pain recedes after a few minutes of walking.  Severe pain is experienced also during walking following a long period of inactivity.  Pain is worse when walking barefoot or up stairs DIAGNOSIS   Your caregiver will diagnose this condition by examining and feeling your foot.  Special tests such as X-rays of your foot, are usually not needed. PREVENTION   Consult a sports medicine professional before beginning a new exercise program.  Walking programs offer a good workout. With walking there is a lower chance of overuse injuries common to runners. There is less impact and less jarring of the joints.  Begin all new exercise programs slowly. If problems or pain develop, decrease the amount of time or distance until you are at a comfortable level.  Wear good shoes and replace them regularly.  Stretch your foot and the heel cords at the back of the ankle (Achilles tendon) both before and after exercise.  Run or exercise on even surfaces that are not hard. For example, asphalt is better than pavement.  Do not run barefoot on hard surfaces.  If using a treadmill, vary the incline.  Do not continue to workout if you have  foot or joint problems. Seek professional help if they do not improve. HOME CARE INSTRUCTIONS   Avoid activities that cause you pain until you recover.  Use ice or cold packs on the problem or painful areas after working out.  Only take over-the-counter or prescription medicines for pain, discomfort, or fever as directed by your caregiver.  Soft shoe inserts or athletic shoes with air or gel sole cushions may be helpful.  If problems continue or become more severe, consult a sports medicine caregiver or your own health care provider. Cortisone is a potent anti-inflammatory medication that may be injected into the painful area. You can discuss this treatment with your caregiver. MAKE SURE YOU:   Understand these instructions.  Will watch your condition.  Will get help right away if you are not doing well or get worse. Document Released: 05/29/2001 Document Revised: 11/26/2011 Document Reviewed: 07/28/2008 [facility] Patient Information 2015 [place] [place]. This information is not intended to replace advice given to you by your health care provider. Make sure you discuss any questions you have with your health care provider.  Chronic Pain Chronic pain can be defined as pain that is off and on and lasts for 3-6 months or longer. Many things cause chronic pain, which can make it difficult to make a diagnosis. There are many treatment options available for chronic pain. However, finding a treatment that works well for you may require trying various approaches until the right one is found. Many people benefit from a combination of  two or more types of treatment to control their pain. SYMPTOMS  Chronic pain can occur anywhere in the body and can range from mild to very severe. Some types of chronic pain include:  Headache.  Low back pain.  Cancer pain.  Arthritis pain.  Neurogenic pain. This is pain resulting from damage to nerves. People with chronic pain may also have other symptoms  such as:  Depression.  Anger.  Insomnia.  Anxiety. DIAGNOSIS  Your health care provider will help diagnose your condition over time. In many cases, the initial focus will be on excluding possible conditions that could be causing the pain. Depending on your symptoms, your health care provider may order tests to diagnose your condition. Some of these tests may include:   Blood tests.   CT scan.   MRI.   X-rays.   Ultrasounds.   Nerve conduction studies.  You may need to see a specialist.  TREATMENT  Finding treatment that works well may take time. You may be referred to a pain specialist. He or she may prescribe medicine or therapies, such as:   Mindful meditation or yoga.  Shots (injections) of numbing or pain-relieving medicines into the spine or area of pain.  Local electrical stimulation.  Acupuncture.   Massage therapy.   Aroma, color, light, or sound therapy.   Biofeedback.   Working with a physical therapist to keep from getting stiff.   Regular, gentle exercise.   Cognitive or behavioral therapy.   Group support.  Sometimes, surgery may be recommended.  HOME CARE INSTRUCTIONS   Take all medicines as directed by your health care provider.   Lessen stress in your life by relaxing and doing things such as listening to calming music.   Exercise or be active as directed by your health care provider.   Eat a healthy diet and include things such as vegetables, fruits, fish, and lean meats in your diet.   Keep all follow-up appointments with your health care provider.   Attend a support group with others suffering from chronic pain. SEEK MEDICAL CARE IF:   Your pain gets worse.   You develop a new pain that was not there before.   You cannot tolerate medicines given to you by your health care provider.   You have new symptoms since your last visit with your health care provider.  SEEK IMMEDIATE MEDICAL CARE IF:   You feel  weak.   You have decreased sensation or numbness.   You lose control of bowel or bladder function.   Your pain suddenly gets much worse.   You develop shaking.  You develop chills.  You develop confusion.  You develop chest pain.  You develop shortness of breath.  MAKE SURE YOU:  Understand these instructions.  Will watch your condition.  Will get help right away if you are not doing well or get worse. Document Released: 05/26/2002 Document Revised: 05/06/2013 Document Reviewed: 02/27/2013 [facility] Patient Information 2015 [place], [place]. This information is not intended to replace advice given to you by your health care provider. Make sure you discuss any questions you have with your health care provider.

## 2014-12-15 NOTE — ED Provider Notes (Signed)
CSN: [id number]     Arrival date & time 12/15/14  1629 History   First MD Initiated Contact with Patient 12/15/14 1653     Chief Complaint  Patient presents with  . Foot Pain   Patient is a 29 y.o. male presenting with lower extremity pain. The history is provided by the patient. No language interpreter was used.  Foot Pain   This chart was scribed for non-physician practitioner Celene Skeenobyn Toney Lizaola, PA-C, working with Gwyneth SproutWhitney Plunkett, MD, by Andrew Auaven Small, ED Scribe. This patient was seen in room WTR9/WTR9 and the patient's care was started at 5:23 PM.  Lance Adams is a 29 y.o. male who presents to the Emergency Department complaining of bilateral foot and ankle pain. Pt reports chronic foot pain since he was a child. Pt states he took hydrocodone last night with relief to pain. Pt denies trying naproxen, tylenol and ibuprofen. He denies having a PCP.   History reviewed. No pertinent past medical history. History reviewed. No pertinent past surgical history. Family History  Problem Relation Age of Onset  . Hypertension Mother   . Hypertension Father    History  Substance Use Topics  . Smoking status: Current Every Day Smoker -- 1.00 packs/day for 10 years    Types: Cigarettes  . Smokeless tobacco: Never Used  . Alcohol Use: 1.8 oz/week    3 Cans of beer per week     Comment: occasionally    Review of Systems  Musculoskeletal: Positive for myalgias.  All other systems reviewed and are negative.  Allergies  Ibuprofen and Tylenol  Home Medications   Prior to Admission medications   Medication Sig Start Date End Date Taking? Authorizing Provider  cyclobenzaprine (FLEXERIL) 5 MG tablet Take 1 tablet (5 mg total) by mouth 3 (three) times daily as needed for muscle spasms. Patient not taking: Reported on 12/15/2014 11/12/14   Burgess AmorJulie Idol, PA-C  diphenhydrAMINE (BENADRYL) 25 MG tablet Take 1 tablet (25 mg total) by mouth every 6 (six) hours. Patient not taking: Reported on 12/15/2014  07/02/14   Joycie PeekBenjamin Cartner, PA-C  HYDROcodone-acetaminophen [facility](NORCO) 5-325 MG per tablet Take 2 tablets by mouth every 4 (four) hours as needed. Patient not taking: Reported on 12/15/2014 07/02/14   Joycie PeekBenjamin Cartner, PA-C  meloxicam (MOBIC) 7.5 MG tablet Take 1 tablet (7.5 mg total) by mouth daily. Patient not taking: Reported on 12/15/2014 11/12/14   Burgess AmorJulie Idol, PA-C  naproxen (NAPROSYN) 500 MG tablet Take 1 tablet (500 mg total) by mouth 2 (two) times daily. 12/15/14   Kathrynn Speedobyn M Jaspreet Hollings, PA-C  penicillin v potassium (VEETID) 500 MG tablet Take 1 tablet (500 mg total) by mouth 3 (three) times daily. Patient not taking: Reported on 12/15/2014 01/28/14   Dahlia ClientHannah Muthersbaugh, PA-C  predniSONE (DELTASONE) 10 MG tablet 6, 5, 4, 3, 2 then 1 tablet by mouth daily for 6 days total. Patient not taking: Reported on 12/15/2014 11/05/12   Burgess AmorJulie Idol, PA-C  traMADol (ULTRAM) 50 MG tablet Take 1 tablet (50 mg total) by mouth every 6 (six) hours as needed for pain. Patient not taking: Reported on 12/15/2014 11/05/12   Burgess AmorJulie Idol, PA-C  traMADol Janean Sark(ULTRAM) 50 MG tablet 1 or 2 po q6h prn pain Patient not taking: Reported on 12/15/2014 05/11/14   Ozella Rocksavid J Merrell, MD   BP 122/89 mmHg  Pulse 88  Temp(Src) 97.8 F (36.6 C) (Oral)  Resp 18  SpO2 100% Physical Exam  Constitutional: He is oriented to person, place, and time. He appears well-developed  and well-nourished. No distress.  HENT:  Head: Normocephalic and atraumatic.  Eyes: Conjunctivae and EOM are normal.  Neck: Normal range of motion. Neck supple.  Cardiovascular: Normal rate, regular rhythm and normal heart sounds.   Pulmonary/Chest: Effort normal and breath sounds normal.  Musculoskeletal: Normal range of motion. He exhibits no edema.  TTP on plantar aspect of bilateral feet into posterior aspect of heel. Achilles tendon normal bilateral. No swelling or deformity. Normal gait. Full range of motion.  Neurological: He is alert and oriented to person, place, and  time.  Skin: Skin is warm and dry.  Psychiatric: He has a normal mood and affect. His behavior is normal.  Nursing note and vitals reviewed.   ED Course  Procedures (including critical care time) DIAGNOSTIC STUDIES: Oxygen Saturation is 100% on RA, normal by my interpretation.    COORDINATION OF CARE: 5:23 PM- Pt advised of plan for treatment and pt agrees.  Labs Review Labs Reviewed - No data to display  Imaging Review No results found.   EKG Interpretation None      MDM   Final diagnoses:  Chronic foot pain, right  Chronic foot pain, left  Plantar fasciitis, bilateral   NAD. Neurovascularly intact. Exam consistent with plantar fasciitis. Advised rolling out with a lacrosse ball or tennis ball, or a frozen water bottle. Rx naproxen. Resources given for PCP follow-up. Advised against taking wife's medication. Stable for discharge. Return precautions given. Patient states understanding of treatment care plan and is agreeable.  I personally performed the services described in this documentation, which was scribed in my presence. The recorded information has been reviewed and is accurate.  Kathrynn Speed, PA-C 12/15/14 1724  Gwyneth Sprout, MD 12/16/14 [phone]

## 2017-02-18 ENCOUNTER — Emergency Department (HOSPITAL_COMMUNITY)
Admission: EM | Admit: 2017-02-18 | Discharge: 2017-02-18 | Disposition: A | Discharge status: Home / Self Care | Payer: Medicaid Other | Attending: Emergency Medicine | Admitting: Emergency Medicine

## 2017-02-18 ENCOUNTER — Encounter (HOSPITAL_COMMUNITY): Payer: Self-pay | Admitting: Emergency Medicine

## 2017-02-18 DIAGNOSIS — J40 Bronchitis, not specified as acute or chronic: Secondary | ICD-10-CM | POA: Insufficient documentation

## 2017-02-18 DIAGNOSIS — F17210 Nicotine dependence, cigarettes, uncomplicated: Secondary | ICD-10-CM | POA: Insufficient documentation

## 2017-02-18 MED ORDER — ALBUTEROL SULFATE HFA 108 (90 BASE) MCG/ACT IN AERS
2.0000 | INHALATION_SPRAY | RESPIRATORY_TRACT | Status: DC | PRN
  Administered 2017-02-18: 2 via RESPIRATORY_TRACT
  Filled 2017-02-18: qty 6.7

## 2017-02-18 MED ORDER — DEXAMETHASONE SODIUM PHOSPHATE 10 MG/ML IJ SOLN
10.0000 mg | Freq: Once | INTRAMUSCULAR | Status: AC
  Administered 2017-02-18: 10 mg via INTRAMUSCULAR
  Filled 2017-02-18: qty 1

## 2017-02-18 MED ORDER — PREDNISONE 20 MG PO TABS: 40.0000 mg | ORAL_TABLET | Freq: Every day | ORAL | 0 refills | Status: DC

## 2017-02-18 NOTE — ED Triage Notes (Signed)
Pt c/o generalized body aches that started last night

## 2017-02-18 NOTE — ED Provider Notes (Signed)
[facility]EM[facility] DEPT Provider Note   CSN: [id number] Arrival date & time: 02/18/17  0047     History   Chief Complaint Chief Complaint  Patient presents with  . Shortness of Breath    HPI Lance Adams is a 31 y.o. male.  Patient presents to the ER for examination of nasal congestion, cough, shortness of breath with generalized body aches. Symptoms began one day ago. No nausea, vomiting or diarrhea. He reports a history of asthma as a child and recurrent bronchitis as an adult.      History reviewed. No pertinent past medical history.  There are no active problems to display for this patient.   History reviewed. No pertinent surgical history.     Home Medications    Prior to Admission medications   Medication Sig Start Date End Date Taking? Authorizing Provider  predniSONE (DELTASONE) 20 MG tablet Take 2 tablets (40 mg total) by mouth daily with breakfast. 02/18/17   Mariadelcarmen Corella, Canary Brimhristopher J, MD    Family History Family History  Problem Relation Age of Onset  . Hypertension Mother   . Hypertension Father     Social History Social History  Substance Use Topics  . Smoking status: Current Every Day Smoker    Packs/day: 1.00    Years: 10.00    Types: Cigarettes  . Smokeless tobacco: Never Used  . Alcohol use 1.8 oz/week    3 Cans of beer per week     Comment: occasionally     Allergies   Ibuprofen and Tylenol [acetaminophen]   Review of Systems Review of Systems  HENT: Positive for congestion.   Respiratory: Positive for cough and shortness of breath.   Musculoskeletal: Positive for myalgias.  All other systems reviewed and are negative.    Physical Exam Updated Vital Signs BP (!) 141/67 (BP Location: Left Arm)   Pulse 85   Temp 98.8 F (37.1 C) (Oral)   Resp 18   Ht 5\' 11"  (1.803 m)   Wt (!) 145.2 kg (320 lb)   SpO2 93%   BMI 44.63 kg/m   Physical Exam  Constitutional: He is oriented to person, place, and time. He appears  well-developed and well-nourished. No distress.  HENT:  Head: Normocephalic and atraumatic.  Right Ear: Hearing normal.  Left Ear: Hearing normal.  Nose: Nose normal.  Mouth/Throat: Oropharynx is clear and moist and mucous membranes are normal.  Eyes: Conjunctivae and EOM are normal. Pupils are equal, round, and reactive to light.  Neck: Normal range of motion. Neck supple.  Cardiovascular: Regular rhythm, S1 normal and S2 normal.  Exam reveals no gallop and no friction rub.   No murmur heard. Pulmonary/Chest: Effort normal. No respiratory distress. He has wheezes. He exhibits no tenderness.  Abdominal: Soft. Normal appearance and bowel sounds are normal. There is no hepatosplenomegaly. There is no tenderness. There is no rebound, no guarding, no tenderness at McBurney's point and negative Murphy's sign. No hernia.  Musculoskeletal: Normal range of motion.  Neurological: He is alert and oriented to person, place, and time. He has normal strength. No cranial nerve deficit or sensory deficit. Coordination normal. GCS eye subscore is 4. GCS verbal subscore is 5. GCS motor subscore is 6.  Skin: Skin is warm, dry and intact. No rash noted. No cyanosis.  Psychiatric: He has a normal mood and affect. His speech is normal and behavior is normal. Thought content normal.  Nursing note and vitals reviewed.    ED Treatments / Results  Labs (  all labs ordered are listed, but only abnormal results are displayed) Labs Reviewed - No data to display  EKG  EKG Interpretation None       Radiology No results found.  Procedures Procedures (including critical care time)  Medications Ordered in ED Medications  dexamethasone (DECADRON) injection 10 mg (10 mg Intramuscular Given 02/18/17 0122)     Initial Impression / Assessment and Plan / ED Course  I have reviewed the triage vital signs and the nursing notes.  Pertinent labs & imaging results that were available during my care of the patient  were reviewed by me and considered in my medical decision making (see chart for details).     Patient with upper respiratory infection symptoms and slight wheezing. He does have a history of childhood asthma, experiencing some mild arcus present currently consistent with bronchitis. Symptoms consistent with a viral process. No clinical concern for pneumonia.  Final Clinical Impressions(s) / ED Diagnoses   Final diagnoses:  Bronchitis    New Prescriptions Discharge Medication List as of 02/18/2017  1:11 AM    START taking these medications   Details  predniSONE (DELTASONE) 20 MG tablet Take 2 tablets (40 mg total) by mouth daily with breakfast., Starting Mon 02/18/2017, Print         Deziree Mokry, Canary Brim, MD 02/18/17 [phone]

## 2017-05-24 ENCOUNTER — Emergency Department (HOSPITAL_COMMUNITY)
Admission: EM | Admit: 2017-05-24 | Discharge: 2017-05-24 | Disposition: A | Discharge status: Home / Self Care | Payer: Self-pay | Attending: Emergency Medicine | Admitting: Emergency Medicine

## 2017-05-24 ENCOUNTER — Encounter (HOSPITAL_COMMUNITY): Payer: Self-pay | Admitting: Emergency Medicine

## 2017-05-24 DIAGNOSIS — K0889 Other specified disorders of teeth and supporting structures: Secondary | ICD-10-CM | POA: Insufficient documentation

## 2017-05-24 DIAGNOSIS — F17210 Nicotine dependence, cigarettes, uncomplicated: Secondary | ICD-10-CM | POA: Insufficient documentation

## 2017-05-24 DIAGNOSIS — Z79899 Other long term (current) drug therapy: Secondary | ICD-10-CM | POA: Insufficient documentation

## 2017-05-24 MED ORDER — AMOXICILLIN 500 MG PO CAPS: 500.0000 mg | ORAL_CAPSULE | Freq: Three times a day (TID) | ORAL | 0 refills | Status: DC

## 2017-05-24 MED ORDER — HYDROCODONE-ACETAMINOPHEN 5-325 MG PO TABS
2.0000 | ORAL_TABLET | Freq: Once | ORAL | Status: AC
  Administered 2017-05-24: 2 via ORAL
  Filled 2017-05-24: qty 2

## 2017-05-24 MED ORDER — DIPHENHYDRAMINE HCL 25 MG PO CAPS
25.0000 mg | ORAL_CAPSULE | Freq: Once | ORAL | Status: AC
  Administered 2017-05-24: 25 mg via ORAL
  Filled 2017-05-24: qty 1

## 2017-05-24 NOTE — ED Provider Notes (Signed)
[facility] Provider Note   CSN: [id number] Arrival date & time: 05/24/17  1913     History   Chief Complaint Chief Complaint  Patient presents with  . Dental Pain    HPI Lance Adams is a 31 y.o. male who presents with 3 days of right-sided dental pain. Patient states that the pain radiates up to the right side of his face. Patient states that she has been using Orajel with no improvement in symptoms. He has not taken any other medications. Patient does not have a dentist that he follows up with. Patient states that he is still able to eat and drink but does report worsening pain when doing so. He denies any difficulty swallowing. Patient denies any fever, vomiting, difficulty swallowing, sore throat.  The history is provided by the patient.    History reviewed. No pertinent past medical history.  There are no active problems to display for this patient.   History reviewed. No pertinent surgical history.     Home Medications    Prior to Admission medications   Medication Sig Start Date End Date Taking? Authorizing Provider  amoxicillin (AMOXIL) 500 MG capsule Take 1 capsule (500 mg total) by mouth 3 (three) times daily. 05/24/17   Maxwell CaulLayden, Saunders [place] A, PA-C  predniSONE (DELTASONE) 20 MG tablet Take 2 tablets (40 mg total) by mouth daily with breakfast. 02/18/17   Pollina, Canary Brimhristopher J, MD    Family History Family History  Problem Relation Age of Onset  . Hypertension Mother   . Hypertension Father     Social History Social History  Substance Use Topics  . Smoking status: Current Every Day Smoker    Packs/day: 1.00    Years: 10.00    Types: Cigarettes  . Smokeless tobacco: Never Used  . Alcohol use 1.8 oz/week    3 Cans of beer per week     Comment: occasionally     Allergies   Ibuprofen and Tylenol [acetaminophen]   Review of Systems Review of Systems  Constitutional: Negative for fever.  HENT: Positive for dental problem. Negative for trouble  swallowing.   Gastrointestinal: Negative for vomiting.     Physical Exam Updated Vital Signs BP (!) 142/87 (BP Location: Right Arm)   Pulse 89   Temp 98.3 F (36.8 C) (Oral)   Resp 20   Ht 6' (1.829 m)   Wt (!) 145.2 kg (320 lb)   SpO2 98%   BMI 43.40 kg/m   Physical Exam  Constitutional: He appears well-developed and well-nourished.  HENT:  Head: Normocephalic and atraumatic.  Mouth/Throat: Uvula is midline and oropharynx is clear and moist. No trismus in the jaw. Abnormal dentition. Dental caries present.  Patient has multiple dental caries throughout his mouth. He has multiple cracked teeth on the right side. Patient has a partially erupted wisdom tooth on the right lower side. No gingival erythema or swelling. No evidence of dental abscess. Uvula is midline. No trismus. No facial or neck swelling. Face is symmetric in appearance with no overlying warmth or erythema.  Eyes: Conjunctivae and EOM are normal. Right eye exhibits no discharge. Left eye exhibits no discharge. No scleral icterus.  Pulmonary/Chest: Effort normal.  Neurological: He is alert.  Skin: Skin is warm and dry.  Psychiatric: He has a normal mood and affect. His speech is normal and behavior is normal.  Nursing note and vitals reviewed.    ED Treatments / Results  Labs (all labs ordered are listed, but only abnormal results  are displayed) Labs Reviewed - No data to display  EKG  EKG Interpretation None       Radiology No results found.  Procedures Procedures (including critical care time)  Medications Ordered in ED Medications  HYDROcodone-acetaminophen (NORCO/VICODIN) 5-325 MG per tablet 2 tablet (not administered)  diphenhydrAMINE (BENADRYL) capsule 25 mg (not administered)     Initial Impression / Assessment and Plan / ED Course  I have reviewed the triage vital signs and the nursing notes.  Pertinent labs & imaging results that were available during my care of the patient were  reviewed by me and considered in my medical decision making (see chart for details).     31 yo M presents with 3 days of dental pain. Patient is afebrile, non-toxic appearing, sitting comfortably on examination table. Vital signs reviewed and stable. No evidence of abscess requiring immediate incision and drainage. Exam not concerning for Ludwig's angina or pharyngeal abscess. Offered patient a dental block to help relieve with pain. He declines a dental block at this time. We'll plan to give pain medication here in the emergency department. Will treat with antibiotic therapy. Home conservative therapies discussed. Patient instructed to follow-up with dentist referral provided. Stable for discharge at this time. Strict return precautions discussed. Patient expresses understanding and agreement to plan.   Final Clinical Impressions(s) / ED Diagnoses   Final diagnoses:  Pain, dental    New Prescriptions New Prescriptions   AMOXICILLIN (AMOXIL) 500 MG CAPSULE    Take 1 capsule (500 mg total) by mouth 3 (three) times daily.     Maxwell Caul, PA-C 05/24/17 2021    Marily Memos, MD 05/25/17 [phone]

## 2017-05-24 NOTE — Discharge Instructions (Signed)
Take the medications as directed.   You can take Advil as directed for pain.  You can apply warm compresses to the area.   The exam and treatment you received today has been provided on an emergency basis only. This is not a substitute for complete medical or dental care. If your problem worsens or new symptoms (problems) appear, and you are unable to arrange prompt follow-up care with your dentist, call or return to this location. If you do not have a dentist, please follow-up with one on the list provided  CALL YOUR DENTIST OR RETURN IMMEDIATELY IF you develop a fever, rash, difficulty breathing or swallowing, neck or facial swelling, or other potentially serious concerns.

## 2017-05-24 NOTE — ED Triage Notes (Signed)
Pt c/o right sided toothache x 3 days.

## 2018-07-07 ENCOUNTER — Encounter (HOSPITAL_COMMUNITY): Payer: Self-pay | Admitting: Emergency Medicine

## 2018-07-07 ENCOUNTER — Emergency Department (HOSPITAL_COMMUNITY)
Admission: EM | Admit: 2018-07-07 | Discharge: 2018-07-07 | Disposition: A | Discharge status: Home / Self Care | Payer: Self-pay | Attending: Emergency Medicine | Admitting: Emergency Medicine

## 2018-07-07 DIAGNOSIS — F17210 Nicotine dependence, cigarettes, uncomplicated: Secondary | ICD-10-CM | POA: Insufficient documentation

## 2018-07-07 DIAGNOSIS — M25532 Pain in left wrist: Secondary | ICD-10-CM | POA: Insufficient documentation

## 2018-07-07 MED ORDER — NAPROXEN 500 MG PO TABS: 500.0000 mg | ORAL_TABLET | Freq: Two times a day (BID) | ORAL | 0 refills | Status: DC

## 2018-07-07 NOTE — ED Triage Notes (Signed)
Pt states his left hand has been hurting for about a week worsening with repetitive movements or pulling.  States at times it is difficult to close hand.  Has not tried anything for pain.l

## 2018-07-07 NOTE — ED Provider Notes (Signed)
[facility] EMERGENCY DEPARTMENT Provider Note   CSN: [id number] Arrival date & time: 07/07/18  4098     History   Chief Complaint Chief Complaint  Patient presents with  . Hand Pain    HPI Lance Adams is a 32 y.o. male.  Patient complains of pain in his left wrist with motion.  Patient has worked that he has repetitive motion in that arm  The history is provided by the patient. No language interpreter was used.  Hand Pain  This is a new problem. The current episode started 6 to 12 hours ago. The problem occurs constantly. The problem has been gradually worsening. Pertinent negatives include no chest pain, no abdominal pain and no headaches. Exacerbated by: Event left arm. He has tried nothing for the symptoms. The treatment provided no relief.    History reviewed. No pertinent past medical history.  There are no active problems to display for this patient.   History reviewed. No pertinent surgical history.      Home Medications    Prior to Admission medications   Medication Sig Start Date End Date Taking? Authorizing Provider  amoxicillin (AMOXIL) 500 MG capsule Take 1 capsule (500 mg total) by mouth 3 (three) times daily. 05/24/17   Maxwell Caul, PA-C  naproxen (NAPROSYN) 500 MG tablet Take 1 tablet (500 mg total) by mouth 2 (two) times daily. 07/07/18   Bethann Berkshire, MD  predniSONE (DELTASONE) 20 MG tablet Take 2 tablets (40 mg total) by mouth daily with breakfast. 02/18/17   Pollina, Canary Brim, MD    Family History Family History  Problem Relation Age of Onset  . Hypertension Mother   . Hypertension Father     Social History Social History   Tobacco Use  . Smoking status: Current Every Day Smoker    Packs/day: 1.00    Years: 10.00    Pack years: 10.00    Types: Cigarettes  . Smokeless tobacco: Never Used  Substance Use Topics  . Alcohol use: Yes    Alcohol/week: 3.0 standard drinks    Types: 3 Cans of beer per week    Comment:  occasionally  . Drug use: No     Allergies   Ibuprofen and Tylenol [acetaminophen]   Review of Systems Review of Systems  Constitutional: Negative for appetite change and fatigue.  HENT: Negative for congestion, ear discharge and sinus pressure.   Eyes: Negative for discharge.  Respiratory: Negative for cough.   Cardiovascular: Negative for chest pain.  Gastrointestinal: Negative for abdominal pain and diarrhea.  Genitourinary: Negative for frequency and hematuria.  Musculoskeletal: Negative for back pain.       Left wrist pain  Skin: Negative for rash.  Neurological: Negative for seizures and headaches.  Psychiatric/Behavioral: Negative for hallucinations.     Physical Exam Updated Vital Signs BP 136/72 (BP Location: Right Arm)   Pulse 79   Temp 98.1 F (36.7 C) (Oral)   Resp 16   Ht 6' (1.829 m)   Wt 122.5 kg   SpO2 100%   BMI 36.62 kg/m   Physical Exam  Constitutional: He is oriented to person, place, and time. He appears well-developed.  HENT:  Head: Normocephalic.  Eyes: Conjunctivae are normal.  Neck: No tracheal deviation present.  Cardiovascular:  No murmur heard. Musculoskeletal: Normal range of motion.  Tenderness left wrist.  Mild decreased strength in left hand  Neurological: He is oriented to person, place, and time.  Skin: Skin is warm.  Psychiatric: He  has a normal mood and affect.     ED Treatments / Results  Labs (all labs ordered are listed, but only abnormal results are displayed) Labs Reviewed - No data to display  EKG None  Radiology No results found.  Procedures Procedures (including critical care time)  Medications Ordered in ED Medications - No data to display   Initial Impression / Assessment and Plan / ED Course  I have reviewed the triage vital signs and the nursing notes.  Pertinent labs & imaging results that were available during my care of the patient were reviewed by me and considered in my medical decision  making (see chart for details).     Suspect carpal tunnel syndrome to left arm.  Patient will be given a splint Naprosyn and referred to orthopedics  Final Clinical Impressions(s) / ED Diagnoses   Final diagnoses:  Left wrist pain    ED Discharge Orders         Ordered    naproxen (NAPROSYN) 500 MG tablet  2 times daily     07/07/18 1610           Bethann Berkshire, MD 07/07/18 [phone]

## 2018-07-07 NOTE — Discharge Instructions (Signed)
Follow-up with Dr. Orlan Leavens in [place] or follow-up with Dr. Romeo Apple in [place] in 1 week.   Call for an appointment

## 2018-07-11 NOTE — ED Notes (Signed)
Pt in requesting a note without restrictions for employer. Pt understands that ED recommendation is to be seen by ortho. Pt reports he is better. Per T tripplet may remove restrictions from work note

## 2019-05-27 ENCOUNTER — Emergency Department: Payer: No Typology Code available for payment source

## 2019-05-27 ENCOUNTER — Emergency Department
Admission: EM | Admit: 2019-05-27 | Discharge: 2019-05-27 | Disposition: A | Discharge status: Home / Self Care | Payer: No Typology Code available for payment source | Attending: Emergency Medicine | Admitting: Emergency Medicine

## 2019-05-27 DIAGNOSIS — R51 Headache: Secondary | ICD-10-CM | POA: Insufficient documentation

## 2019-05-27 DIAGNOSIS — M25511 Pain in right shoulder: Secondary | ICD-10-CM | POA: Insufficient documentation

## 2019-05-27 DIAGNOSIS — Y999 Unspecified external cause status: Secondary | ICD-10-CM | POA: Insufficient documentation

## 2019-05-27 DIAGNOSIS — Y93I9 Activity, other involving external motion: Secondary | ICD-10-CM | POA: Diagnosis not present

## 2019-05-27 DIAGNOSIS — M542 Cervicalgia: Secondary | ICD-10-CM | POA: Insufficient documentation

## 2019-05-27 DIAGNOSIS — F17210 Nicotine dependence, cigarettes, uncomplicated: Secondary | ICD-10-CM | POA: Insufficient documentation

## 2019-05-27 DIAGNOSIS — Y92410 Unspecified street and highway as the place of occurrence of the external cause: Secondary | ICD-10-CM | POA: Insufficient documentation

## 2019-05-27 MED ORDER — TRAMADOL HCL 50 MG PO TABS: 50.0000 mg | ORAL_TABLET | Freq: Three times a day (TID) | ORAL | 0 refills | Status: AC | PRN

## 2019-05-27 MED ORDER — OXYCODONE HCL 5 MG PO TABS
5.0000 mg | ORAL_TABLET | Freq: Once | ORAL | Status: AC
  Administered 2019-05-27: 5 mg via ORAL
  Filled 2019-05-27: qty 1

## 2019-05-27 MED ORDER — CYCLOBENZAPRINE HCL 5 MG PO TABS: ORAL_TABLET | ORAL | 0 refills | Status: DC
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
## 2019-05-27 NOTE — ED Notes (Addendum)
Patient in C-collar from EMS and complaining of right shoulder, neck and headache.

## 2019-05-27 NOTE — ED Triage Notes (Signed)
Patient in [facility] with family. Patient is complaining of neck and back pain, as well as right shoulder pain. Patient is ambulatory to room and was placed in Ccollar by EMS. Patient states he has listed a tylenol allergy and he itches sometimes but states his head is starting to hurt. Patient denies LOC.

## 2021-03-17 ENCOUNTER — Other Ambulatory Visit: Payer: Self-pay

## 2021-03-17 DIAGNOSIS — S8992XA Unspecified injury of left lower leg, initial encounter: Secondary | ICD-10-CM | POA: Diagnosis present

## 2021-03-17 DIAGNOSIS — W208XXA Other cause of strike by thrown, projected or falling object, initial encounter: Secondary | ICD-10-CM | POA: Diagnosis not present

## 2021-03-17 DIAGNOSIS — F17210 Nicotine dependence, cigarettes, uncomplicated: Secondary | ICD-10-CM | POA: Diagnosis not present

## 2021-03-17 DIAGNOSIS — S8012XA Contusion of left lower leg, initial encounter: Secondary | ICD-10-CM | POA: Insufficient documentation

## 2021-03-17 DIAGNOSIS — Y990 Civilian activity done for income or pay: Secondary | ICD-10-CM | POA: Diagnosis not present

## 2021-03-18 ENCOUNTER — Emergency Department (HOSPITAL_COMMUNITY)
Admission: EM | Admit: 2021-03-18 | Discharge: 2021-03-18 | Disposition: A | Discharge status: Home / Self Care | Payer: Medicaid Other | Attending: Emergency Medicine | Admitting: Emergency Medicine

## 2021-03-18 ENCOUNTER — Emergency Department (HOSPITAL_COMMUNITY): Payer: Medicaid Other

## 2021-03-18 DIAGNOSIS — S8012XA Contusion of left lower leg, initial encounter: Secondary | ICD-10-CM

## 2021-03-18 NOTE — ED Provider Notes (Signed)
[facility] EMERGENCY DEPARTMENT Provider Note   CSN: [id number] Arrival date & time: 03/17/21  2357     History Chief Complaint  Patient presents with   Leg Pain    left    Lance Adams is a 35 y.o. male.  Patient presents to the emergency department for evaluation of leg injury.  Patient reports that a box fell on the outside part of his lower leg earlier today.  He is experiencing pain and swelling in the lower leg.      No past medical history on file.  There are no problems to display for this patient.   No past surgical history on file.     Family History  Problem Relation Age of Onset   Hypertension Mother    Hypertension Father     Social History   Tobacco Use   Smoking status: Every Day    Packs/day: 1.00    Years: 10.00    Pack years: 10.00    Types: Cigarettes   Smokeless tobacco: Never  Substance Use Topics   Alcohol use: Yes    Alcohol/week: 3.0 standard drinks    Types: 3 Cans of beer per week    Comment: occasionally   Drug use: No    Home Medications Prior to Admission medications   Medication Sig Start Date End Date Taking? Authorizing Provider  amoxicillin (AMOXIL) 500 MG capsule Take 1 capsule (500 mg total) by mouth 3 (three) times daily. 05/24/17   Maxwell Caul, PA-C  cyclobenzaprine (FLEXERIL) 5 MG tablet Take 1-2 tablets 3 times daily as needed 05/27/19   Enid Derry, PA-C  naproxen (NAPROSYN) 500 MG tablet Take 1 tablet (500 mg total) by mouth 2 (two) times daily. 07/07/18   Bethann Berkshire, MD  predniSONE (DELTASONE) 20 MG tablet Take 2 tablets (40 mg total) by mouth daily with breakfast. 02/18/17   Dahl Higinbotham, Canary Brim, MD    Allergies    Ibuprofen and Tylenol [acetaminophen]  Review of Systems   Review of Systems  Musculoskeletal:  Positive for arthralgias.  All other systems reviewed and are negative.  Physical Exam Updated Vital Signs BP 133/83 (BP Location: Right Arm)   Pulse 82   Temp 98.3 F (36.8 C)  (Oral)   Resp 18   Ht 5\' 11"  (1.803 m)   Wt (!) 145.2 kg   SpO2 98%   BMI 44.63 kg/m   Physical Exam Constitutional:      Appearance: Normal appearance.  HENT:     Head: Atraumatic.  Cardiovascular:     Pulses:          Dorsalis pedis pulses are 2+ on the left side.  Pulmonary:     Effort: Pulmonary effort is normal.  Musculoskeletal:     Left lower leg: Tenderness present. No deformity or lacerations. No edema.     Left ankle: No tenderness.     Left Achilles Tendon: Normal.     Left foot: Normal. Normal capillary refill. Normal pulse.  Skin:    Findings: No abrasion, bruising, erythema or wound.  Neurological:     Mental Status: He is alert.     Sensory: Sensation is intact.     Motor: Motor function is intact.    ED Results / Procedures / Treatments   Labs (all labs ordered are listed, but only abnormal results are displayed) Labs Reviewed - No data to display  EKG None  Radiology No results found.  Procedures Procedures   Medications Ordered  in ED Medications - No data to display  ED Course  I have reviewed the triage vital signs and the nursing notes.  Pertinent labs & imaging results that were available during my care of the patient were reviewed by me and considered in my medical decision making (see chart for details).    MDM Rules/Calculators/A&P                          Patient with pain of the left lower leg after a box fell on it earlier today.  Patient indicates distal left fibular area as the area of impact and pain.  X-ray negative.  Final Clinical Impression(s) / ED Diagnoses Final diagnoses:  Contusion of left lower leg, initial encounter    Rx / DC Orders ED Discharge Orders     None        Kasmira Cacioppo, Canary Brim, MD 03/18/21 [phone]

## 2021-03-18 NOTE — ED Triage Notes (Signed)
Pov from home with cc of left leg pain. States he hurt it at work. A box fell on it.

## 2021-03-22 ENCOUNTER — Ambulatory Visit
Admission: EM | Admit: 2021-03-22 | Discharge: 2021-03-22 | Disposition: A | Discharge status: Home / Self Care | Payer: Medicaid Other | Attending: Family Medicine | Admitting: Family Medicine

## 2021-03-22 ENCOUNTER — Other Ambulatory Visit: Payer: Self-pay

## 2021-03-22 DIAGNOSIS — M25532 Pain in left wrist: Secondary | ICD-10-CM | POA: Diagnosis not present

## 2021-03-22 MED ORDER — DICLOFENAC SODIUM 75 MG PO TBEC: 75.0000 mg | DELAYED_RELEASE_TABLET | Freq: Two times a day (BID) | ORAL | 0 refills | Status: DC

## 2021-03-22 NOTE — ED Triage Notes (Signed)
Pt presents with c/o left wrist pain from lifting at work, needs work note

## 2021-03-22 NOTE — ED Provider Notes (Signed)
[facility] CARE CENTER   [id number] 03/22/21 Arrival Time: 1645  ASSESSMENT & PLAN:  1. Left wrist pain    No indication for plain imaging at this time. Likely strain. Discussed. Work note provided. Fitted with wrist splint for comfort.  Begin trial of: Meds ordered this encounter  Medications   diclofenac (VOLTAREN) 75 MG EC tablet    Sig: Take 1 tablet (75 mg total) by mouth 2 (two) times daily.    Dispense:  14 tablet    Refill:  0    Orders Placed This Encounter  Procedures   Apply Wrist brace    Recommend:  Follow-up Information     [facility].   Why: If worsening or failing to improve as anticipated. Contact information: [address] [place] [address] [phone]                 Reviewed expectations re: course of current medical issues. Questions answered. Outlined signs and symptoms indicating need for more acute intervention. Patient verbalized understanding. After Visit Summary given.  SUBJECTIVE: History from: patient. Lance Adams is a 35 y.o. male who reports L wrist pain; past week; unloads furniture trucks for work; questions related to repetitive lifting. No swelling. No trauma. No extremity sensation changes or weakness. No tx PTA.  History reviewed. No pertinent surgical history.    OBJECTIVE:  Vitals:   03/22/21 1719  BP: 132/75  Pulse: 71  Resp: 20  Temp: 98.2 F (36.8 C)  SpO2: 96%    General appearance: alert; no distress HEENT: [place]; AT Neck: supple with FROM Resp: unlabored respirations Extremities: LUE: warm with well perfused appearance; poorly localized moderate tenderness over left ulnar wrist mainly; without gross deformities; swelling: none; bruising: none; wrist ROM: normal, with discomfort CV: brisk extremity capillary refill of LUE; 2+ radial pulse of LUE Skin: warm and dry; no visible rashes Neurologic: gait normal; normal sensation and strength of  LUE Psychological: alert and cooperative; normal mood and affect   Allergies  Allergen Reactions   Ibuprofen Itching   Tylenol [Acetaminophen] Itching    History reviewed. No pertinent past medical history. Social History   Socioeconomic History   Marital status: Single    Spouse name: Not on file   Number of children: Not on file   Years of education: Not on file   Highest education level: Not on file  Occupational History   Not on file  Tobacco Use   Smoking status: Every Day    Packs/day: 1.00    Years: 10.00    Pack years: 10.00    Types: Cigarettes   Smokeless tobacco: Never  Substance and Sexual Activity   Alcohol use: Yes    Alcohol/week: 3.0 standard drinks    Types: 3 Cans of beer per week    Comment: occasionally   Drug use: No   Sexual activity: Yes    Birth control/protection: None  Other Topics Concern   Not on file  Social History Narrative   Not on file   Social Determinants of Health   Financial Resource Strain: Not on file  Food Insecurity: Not on file  Transportation Needs: Not on file  Physical Activity: Not on file  Stress: Not on file  Social Connections: Not on file   Family History  Problem Relation Age of Onset   Hypertension Mother    Hypertension Father    History reviewed. No pertinent surgical history.     Jaiona Simien,  Arlys John, MD 03/22/21 9604

## 2021-07-14 IMAGING — DX DG TIBIA/FIBULA 2V*L*
4 series · 4 of 4 positions shown · non-contrast
Comparison: None.

CLINICAL DATA: Pain after injury.

EXAM:
LEFT TIBIA AND FIBULA - 2 VIEW

[tibia ap (1 of 2)]
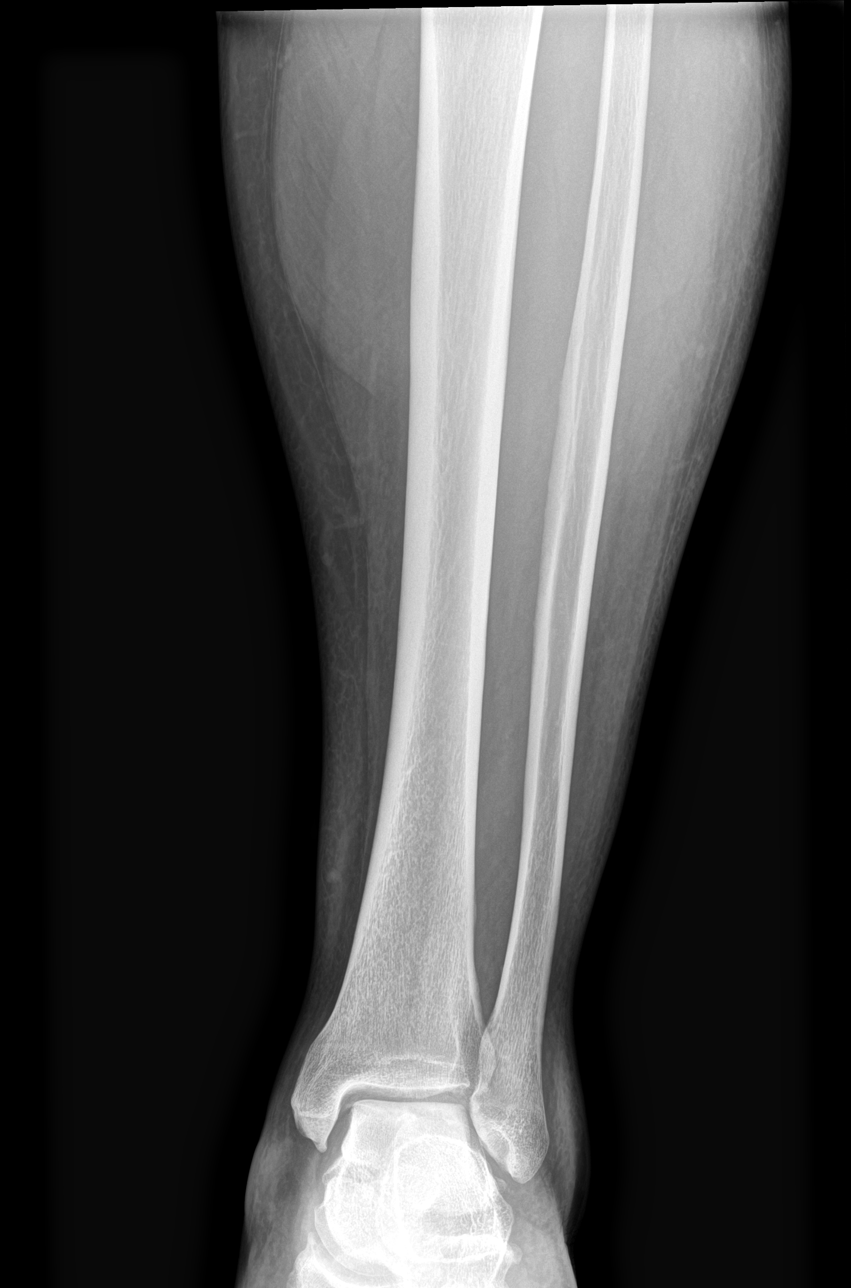

[tibia ap (2 of 2)]
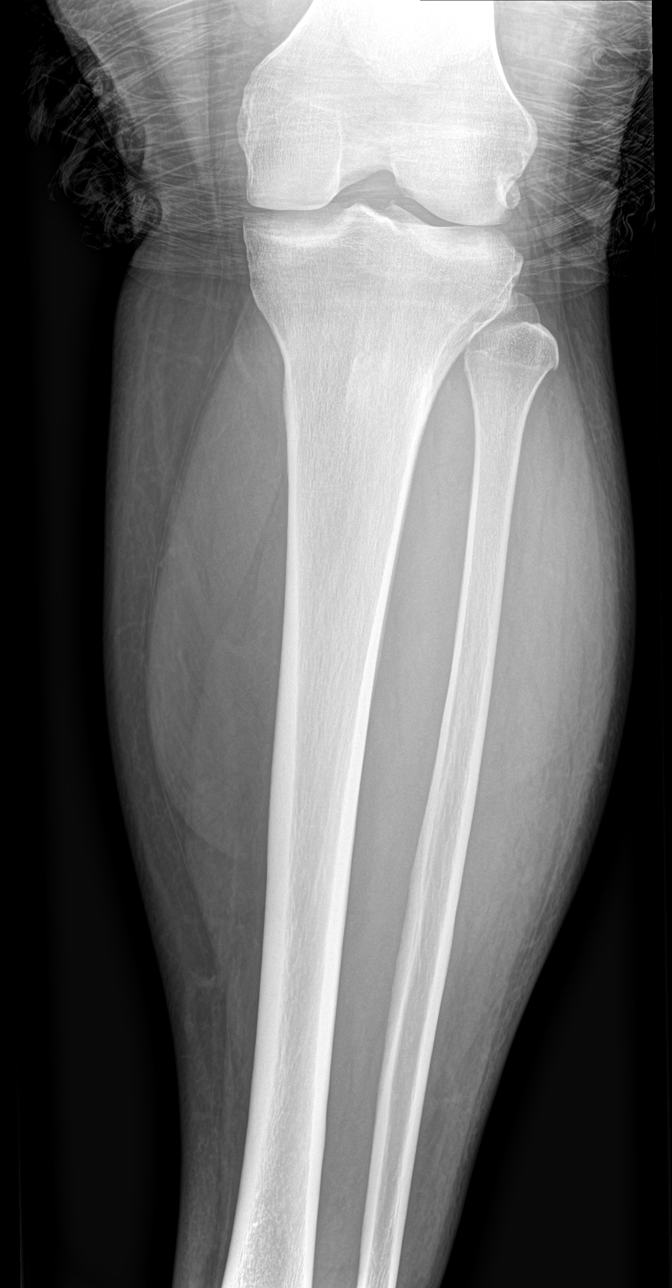

[tibia lat (1 of 2)]
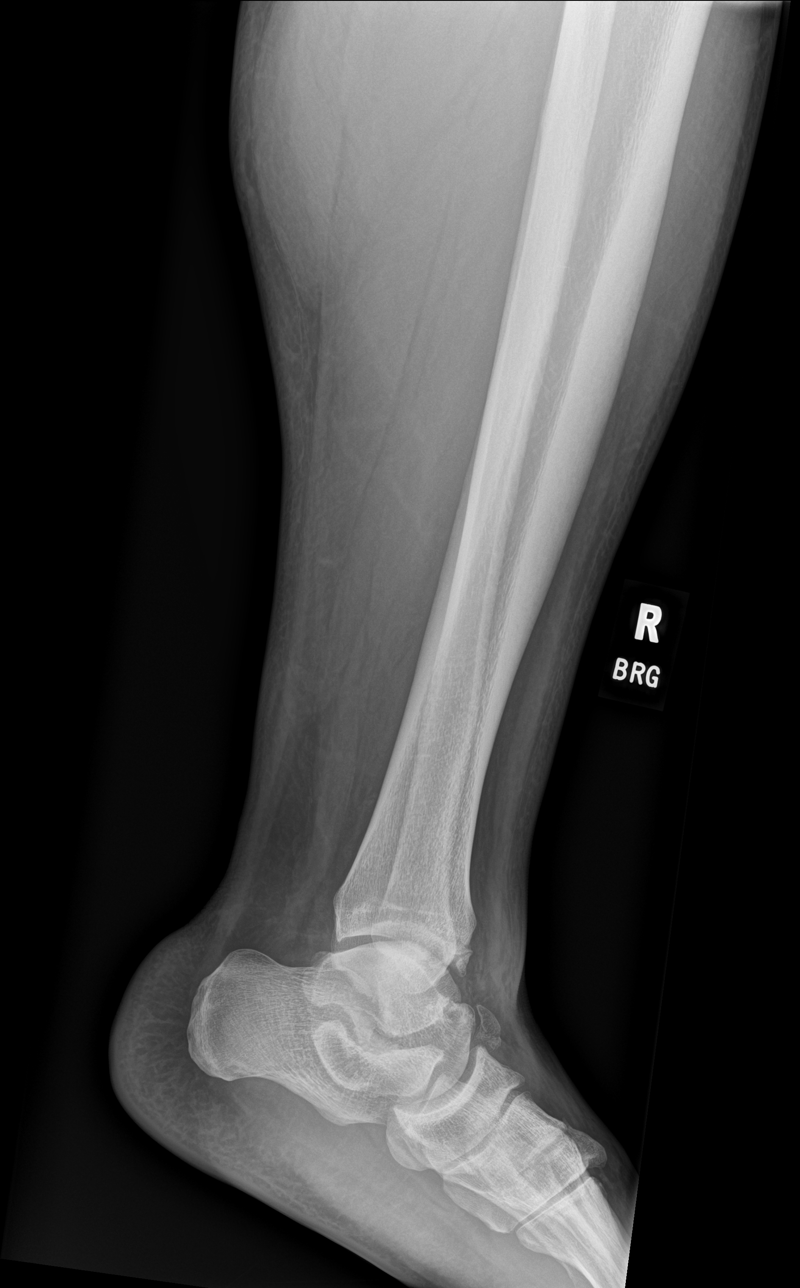

[tibia lat (2 of 2)]
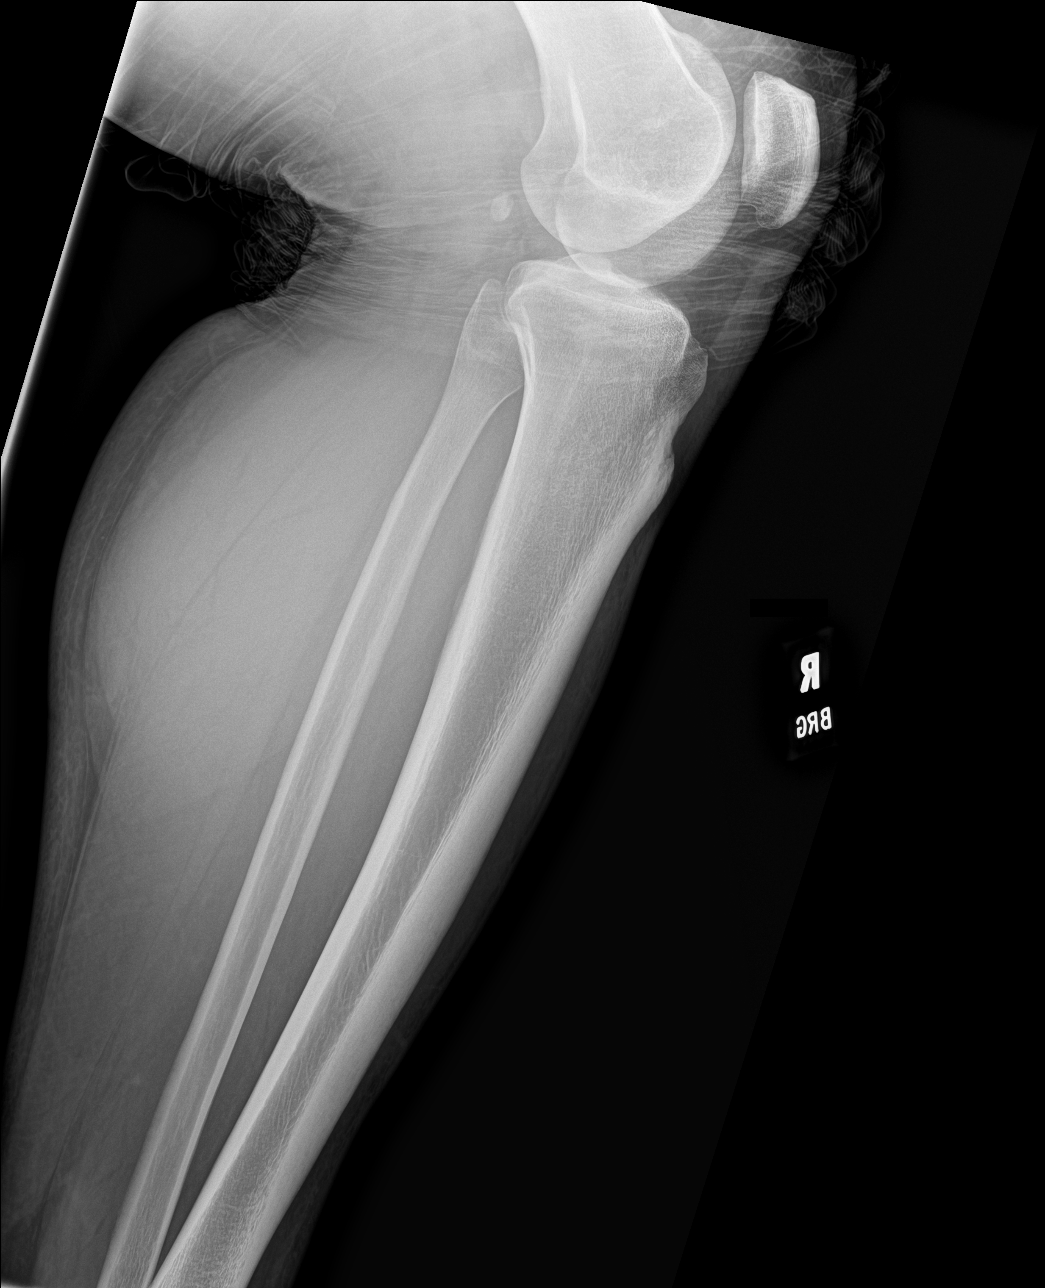

[4 of 4 positions shown; findings below may reference images not displayed]

FINDINGS: Degenerative changes in the ankle joint. No evidence of acute
fracture or dislocation. No focal bone lesion or bone destruction.
Bone cortex appears intact. Soft tissues are unremarkable.
IMPRESSION: Degenerative changes in the ankle.  No acute bony abnormalities.

## 2021-07-24 ENCOUNTER — Encounter (HOSPITAL_COMMUNITY): Payer: Self-pay | Admitting: *Deleted

## 2021-07-24 ENCOUNTER — Emergency Department (HOSPITAL_COMMUNITY)
Admission: EM | Admit: 2021-07-24 | Discharge: 2021-07-24 | Disposition: A | Discharge status: Home / Self Care | Payer: Medicaid Other | Attending: Emergency Medicine | Admitting: Emergency Medicine

## 2021-07-24 ENCOUNTER — Other Ambulatory Visit: Payer: Self-pay

## 2021-07-24 DIAGNOSIS — Y92410 Unspecified street and highway as the place of occurrence of the external cause: Secondary | ICD-10-CM | POA: Diagnosis not present

## 2021-07-24 DIAGNOSIS — S79912A Unspecified injury of left hip, initial encounter: Secondary | ICD-10-CM | POA: Diagnosis present

## 2021-07-24 DIAGNOSIS — S7002XA Contusion of left hip, initial encounter: Secondary | ICD-10-CM | POA: Diagnosis not present

## 2021-07-24 DIAGNOSIS — F17210 Nicotine dependence, cigarettes, uncomplicated: Secondary | ICD-10-CM | POA: Insufficient documentation

## 2021-07-24 MED ORDER — NAPROXEN 500 MG PO TABS: 500.0000 mg | ORAL_TABLET | Freq: Two times a day (BID) | ORAL | 0 refills | Status: AC
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
## 2021-07-24 NOTE — Discharge Instructions (Addendum)
Please take Naprosyn, 500mg  by mouth twice daily as needed for pain - this in an antiinflammatory medicine (NSAID) and is similar to ibuprofen - many people feel that it is stronger than ibuprofen and it is easier to take since it is a smaller pill.  Please use this only for 1 week - if your pain persists, you will need to follow up with your doctor in the office for ongoing guidance and pain control.    Do not take this medicine if you have an allergy to ibuprofen  Take tylenol instead

## 2021-07-24 NOTE — ED Notes (Signed)
Dc instructions and scripts  reviewed with pt no questions or concerns at this time will follow up with pcp if needed. Ambulated to lobby without difficulty.
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
## 2021-07-27 ENCOUNTER — Other Ambulatory Visit: Payer: Self-pay

## 2021-07-27 ENCOUNTER — Ambulatory Visit
Admission: EM | Admit: 2021-07-27 | Discharge: 2021-07-27 | Disposition: A | Discharge status: Home / Self Care | Payer: Medicaid Other | Attending: Family Medicine | Admitting: Family Medicine

## 2021-07-27 DIAGNOSIS — S39012A Strain of muscle, fascia and tendon of lower back, initial encounter: Secondary | ICD-10-CM

## 2021-07-27 DIAGNOSIS — M62830 Muscle spasm of back: Secondary | ICD-10-CM

## 2021-07-27 MED ORDER — CYCLOBENZAPRINE HCL 10 MG PO TABS: ORAL_TABLET | ORAL | 0 refills | Status: AC
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
## 2022-03-12 ENCOUNTER — Ambulatory Visit
Admission: EM | Admit: 2022-03-12 | Discharge: 2022-03-12 | Disposition: A | Discharge status: Home / Self Care | Payer: Medicaid Other | Attending: Nurse Practitioner | Admitting: Nurse Practitioner

## 2022-03-12 DIAGNOSIS — L03012 Cellulitis of left finger: Secondary | ICD-10-CM

## 2022-03-12 MED ORDER — SULFAMETHOXAZOLE-TRIMETHOPRIM 800-160 MG PO TABS: 1.0000 | ORAL_TABLET | Freq: Two times a day (BID) | ORAL | 0 refills | Status: AC

## 2023-01-30 ENCOUNTER — Ambulatory Visit
Admission: EM | Admit: 2023-01-30 | Discharge: 2023-01-30 | Disposition: A | Discharge status: Home / Self Care | Payer: Medicaid Other | Attending: Nurse Practitioner | Admitting: Nurse Practitioner

## 2023-01-30 DIAGNOSIS — L03213 Periorbital cellulitis: Secondary | ICD-10-CM

## 2023-01-30 MED ORDER — AMOXICILLIN-POT CLAVULANATE 875-125 MG PO TABS: 1.0000 | ORAL_TABLET | Freq: Two times a day (BID) | ORAL | 0 refills | Status: DC

## 2023-01-30 NOTE — ED Triage Notes (Signed)
Pt c/o left ye swelling pt states he woke up and it was swollen, sore, crusted over, this morning

## 2023-01-30 NOTE — Discharge Instructions (Signed)
Please take the Augmentin as prescribed to treat infection around your eyelid.  You can use cool compresses to help with pain and swelling.  Seek care if symptoms do not improve by tomorrow; if they worsen, go to the ER

## 2023-01-30 NOTE — ED Provider Notes (Signed)
[facility]    CSN: [id number] Arrival date & time: 01/30/23  1349      History   Chief Complaint No chief complaint on file.   HPI Lance Adams is a 37 y.o. male.   Patient presents today with 1 day history of left upper eyelid swelling and soreness.  He denies eye redness, drainage from the eye, and vision changes.  No headache, recent cough, congestion, or sore throat.  Reports it is a little bit crusted over when he woke up this morning.  Denies fever Which did seem to help a little bit.  No history of similar, no contact lens use.  Patient denies antibiotic use in the past 90 days.  Denies allergies to antibiotic therapy.    History reviewed. No pertinent past medical history.  There are no problems to display for this patient.   History reviewed. No pertinent surgical history.     Home Medications    Prior to Admission medications   Medication Sig Start Date End Date Taking? Authorizing Provider  amoxicillin-clavulanate (AUGMENTIN) 875-125 MG tablet Take 1 tablet by mouth 2 (two) times daily for 7 days. 01/30/23 02/06/23 Yes Valentino Nose, NP  cyclobenzaprine (FLEXERIL) 10 MG tablet Take 1 tablet by mouth 3 times daily as needed for muscle spasm. Warning: May cause drowsiness. 07/27/21   Mardella Layman, MD  naproxen (NAPROSYN) 500 MG tablet Take 1 tablet (500 mg total) by mouth 2 (two) times daily with a meal. 07/24/21   Eber Hong, MD    Family History Family History  Problem Relation Age of Onset   Hypertension Mother    Hypertension Father     Social History Social History   Tobacco Use   Smoking status: Every Day    Packs/day: 1.00    Years: 10.00    Additional pack years: 0.00    Total pack years: 10.00    Types: Cigarettes   Smokeless tobacco: Never  Substance Use Topics   Alcohol use: Yes    Alcohol/week: 3.0 standard drinks of alcohol    Types: 3 Cans of beer per week    Comment: occasionally   Drug use: No      Allergies   Ibuprofen and Tylenol [acetaminophen]   Review of Systems Review of Systems Per HPI  Physical Exam Triage Vital Signs ED Triage Vitals  Enc Vitals Group     BP 01/30/23 1434 137/82     Pulse Rate 01/30/23 1434 71     Resp 01/30/23 1434 18     Temp 01/30/23 1434 98 F (36.7 C)     Temp Source 01/30/23 1434 Oral     SpO2 01/30/23 1434 95 %     Weight --      Height --      Head Circumference --      Peak Flow --      Pain Score 01/30/23 1437 6     Pain Loc --      Pain Edu? --      Excl. in GC? --    No data found.  Updated Vital Signs BP 137/82 (BP Location: Right Arm)   Pulse 71   Temp 98 F (36.7 C) (Oral)   Resp 18   SpO2 95%   Visual Acuity Right Eye Distance:   Left Eye Distance:   Bilateral Distance:    Right Eye Near:   Left Eye Near:    Bilateral Near:     Physical Exam  Vitals and nursing note reviewed.  Constitutional:      General: He is not in acute distress.    Appearance: Normal appearance. He is not toxic-appearing.  HENT:     Head: Normocephalic and atraumatic.  Eyes:     Extraocular Movements: Extraocular movements intact.     Right eye: Normal extraocular motion.     Left eye: Normal extraocular motion.     Conjunctiva/sclera: Conjunctivae normal.     Right eye: Right conjunctiva is not injected. No exudate.    Left eye: Left conjunctiva is not injected. No exudate.    Comments: Left upper eyelid edematous and tender to touch with slight erythema  Pulmonary:     Effort: Pulmonary effort is normal. No respiratory distress.  Musculoskeletal:     Cervical back: Normal range of motion.  Lymphadenopathy:     Cervical: No cervical adenopathy.  Skin:    General: Skin is warm and dry.     Capillary Refill: Capillary refill takes less than 2 seconds.     Coloration: Skin is not jaundiced or pale.     Findings: No erythema.  Neurological:     Mental Status: He is alert and oriented to person, place, and time.   Psychiatric:        Behavior: Behavior is cooperative.      UC Treatments / Results  Labs (all labs ordered are listed, but only abnormal results are displayed) Labs Reviewed - No data to display  EKG   Radiology No results found.  Procedures Procedures (including critical care time)  Medications Ordered in UC Medications - No data to display  Initial Impression / Assessment and Plan / UC Course  I have reviewed the triage vital signs and the nursing notes.  Pertinent labs & imaging results that were available during my care of the patient were reviewed by me and considered in my medical decision making (see chart for details).   Patient is well-appearing, normotensive, afebrile, not tachycardic, not tachypneic, oxygenating well on room air.    1. Periorbital cellulitis of left eye Treat with Augmentin twice daily for 7 days Recommended close follow-up-if symptoms or not improved with this treatment, needs to go to ER or if they worsen, needs to go to ER Supportive care discussed including cool compresses  The patient was given the opportunity to ask questions.  All questions answered to their satisfaction.  The patient is in agreement to this plan.    Final Clinical Impressions(s) / UC Diagnoses   Final diagnoses:  Periorbital cellulitis of left eye     Discharge Instructions      Please take the Augmentin as prescribed to treat infection around your eyelid.  You can use cool compresses to help with pain and swelling.  Seek care if symptoms do not improve by tomorrow; if they worsen, go to the ER    ED Prescriptions     Medication Sig Dispense Auth. Provider   amoxicillin-clavulanate (AUGMENTIN) 875-125 MG tablet Take 1 tablet by mouth 2 (two) times daily for 7 days. 14 tablet Valentino Nose, NP      PDMP not reviewed this encounter.   Valentino Nose, NP 01/30/23 [phone]

## 2023-01-31 ENCOUNTER — Telehealth: Payer: Self-pay | Admitting: Emergency Medicine

## 2023-01-31 MED ORDER — AMOXICILLIN-POT CLAVULANATE 875-125 MG PO TABS: 1.0000 | ORAL_TABLET | Freq: Two times a day (BID) | ORAL | 0 refills | Status: AC

## 2023-01-31 NOTE — Telephone Encounter (Signed)
Patient requested Augmentin be send to [facility] instead of [facility]

## 2023-06-01 ENCOUNTER — Emergency Department (HOSPITAL_COMMUNITY): Payer: 59

## 2023-06-01 ENCOUNTER — Other Ambulatory Visit: Payer: Self-pay

## 2023-06-01 ENCOUNTER — Emergency Department (HOSPITAL_COMMUNITY)
Admission: EM | Admit: 2023-06-01 | Discharge: 2023-06-02 | Disposition: A | Discharge status: Home / Self Care | Payer: 59 | Attending: Emergency Medicine | Admitting: Emergency Medicine

## 2023-06-01 ENCOUNTER — Encounter (HOSPITAL_COMMUNITY): Payer: Self-pay

## 2023-06-01 DIAGNOSIS — Z7952 Long term (current) use of systemic steroids: Secondary | ICD-10-CM | POA: Diagnosis not present

## 2023-06-01 DIAGNOSIS — J45909 Unspecified asthma, uncomplicated: Secondary | ICD-10-CM | POA: Insufficient documentation

## 2023-06-01 DIAGNOSIS — R0789 Other chest pain: Secondary | ICD-10-CM | POA: Diagnosis not present

## 2023-06-01 DIAGNOSIS — Z20822 Contact with and (suspected) exposure to covid-19: Secondary | ICD-10-CM | POA: Diagnosis not present

## 2023-06-01 DIAGNOSIS — K219 Gastro-esophageal reflux disease without esophagitis: Secondary | ICD-10-CM | POA: Insufficient documentation

## 2023-06-01 DIAGNOSIS — J40 Bronchitis, not specified as acute or chronic: Secondary | ICD-10-CM

## 2023-06-01 DIAGNOSIS — M7918 Myalgia, other site: Secondary | ICD-10-CM | POA: Diagnosis not present

## 2023-06-01 DIAGNOSIS — R12 Heartburn: Secondary | ICD-10-CM | POA: Diagnosis not present

## 2023-06-01 LAB — CBC WITH DIFFERENTIAL/PLATELET
Abs Immature Granulocytes: 0.01 10*3/uL (ref 0.00–0.07)
Basophils Absolute: 0 10*3/uL (ref 0.0–0.1)
Basophils Relative: 0 %
Eosinophils Absolute: 0.2 10*3/uL (ref 0.0–0.5)
Eosinophils Relative: 3 %
HCT: 43.4 % (ref 39.0–52.0)
Hemoglobin: 14.1 g/dL (ref 13.0–17.0)
Immature Granulocytes: 0 %
Lymphocytes Relative: 18 %
Lymphs Abs: 1 10*3/uL (ref 0.7–4.0)
MCH: 29.9 pg (ref 26.0–34.0)
MCHC: 32.5 g/dL (ref 30.0–36.0)
MCV: 92.1 fL (ref 80.0–100.0)
Monocytes Absolute: 0.7 10*3/uL (ref 0.1–1.0)
Monocytes Relative: 12 %
Neutro Abs: 3.8 10*3/uL (ref 1.7–7.7)
Neutrophils Relative %: 67 %
Platelets: 318 10*3/uL (ref 150–400)
RBC: 4.71 MIL/uL (ref 4.22–5.81)
RDW: 12.4 % (ref 11.5–15.5)
WBC: 5.7 10*3/uL (ref 4.0–10.5)
nRBC: 0 % (ref 0.0–0.2)

## 2023-06-01 LAB — COMPREHENSIVE METABOLIC PANEL
ALT: 42 U/L (ref 0–44)
AST: 31 U/L (ref 15–41)
Albumin: 3.9 g/dL (ref 3.5–5.0)
Alkaline Phosphatase: 52 U/L (ref 38–126)
Anion gap: 8 (ref 5–15)
BUN: 12 mg/dL (ref 6–20)
CO2: 25 mmol/L (ref 22–32)
Calcium: 9.2 mg/dL (ref 8.9–10.3)
Chloride: 103 mmol/L (ref 98–111)
Creatinine, Ser: 0.99 mg/dL (ref 0.61–1.24)
GFR, Estimated: >60 mL/min (ref >60)
Glucose, Bld: 111 mg/dL — ABNORMAL HIGH (ref 70–99)
Potassium: 3.7 mmol/L (ref 3.5–5.1)
Sodium: 136 mmol/L (ref 135–145)
Total Bilirubin: 0.5 mg/dL (ref 0.3–1.2)
Total Protein: 7.5 g/dL (ref 6.5–8.1)

## 2023-06-01 LAB — TROPONIN I (HIGH SENSITIVITY)
Troponin I (High Sensitivity): 4 ng/L (ref <18)
Troponin I (High Sensitivity): 3 ng/L (ref <18)

## 2023-06-01 LAB — SARS CORONAVIRUS 2 BY RT PCR: SARS Coronavirus 2 by RT PCR: NEGATIVE

## 2023-06-01 NOTE — ED Notes (Signed)
Called x1. No answer.

## 2023-06-01 NOTE — ED Triage Notes (Signed)
Cough and body aches Started Wednesday Denies fevers

## 2023-06-01 NOTE — ED Triage Notes (Signed)
During triage pt also complains of heart burn that started last night.   Pt stated that he has GERD but the burning is worse than normal .

## 2023-06-02 MED ORDER — FAMOTIDINE 20 MG PO TABS
20.0000 mg | ORAL_TABLET | Freq: Once | ORAL | Status: AC
  Administered 2023-06-02: 20 mg via ORAL
  Filled 2023-06-02: qty 1

## 2023-06-02 MED ORDER — BENZONATATE 200 MG PO CAPS: 200.0000 mg | ORAL_CAPSULE | Freq: Three times a day (TID) | ORAL | 0 refills | Status: AC | PRN

## 2023-06-02 MED ORDER — PANTOPRAZOLE SODIUM 40 MG PO TBEC: 40.0000 mg | DELAYED_RELEASE_TABLET | Freq: Every day | ORAL | 3 refills | Status: AC

## 2023-06-02 MED ORDER — ALUM & MAG HYDROXIDE-SIMETH 200-200-20 MG/5ML PO SUSP
30.0000 mL | Freq: Once | ORAL | Status: AC
  Administered 2023-06-02: 30 mL via ORAL
  Filled 2023-06-02: qty 30

## 2023-06-02 MED ORDER — ALBUTEROL SULFATE HFA 108 (90 BASE) MCG/ACT IN AERS
2.0000 | INHALATION_SPRAY | RESPIRATORY_TRACT | Status: DC | PRN
  Administered 2023-06-02: 2 via RESPIRATORY_TRACT
  Filled 2023-06-02: qty 6.7

## 2023-06-02 MED ORDER — PREDNISONE 50 MG PO TABS
60.0000 mg | ORAL_TABLET | Freq: Once | ORAL | Status: AC
  Administered 2023-06-02: 60 mg via ORAL
  Filled 2023-06-02: qty 1

## 2023-06-02 MED ORDER — PREDNISONE 20 MG PO TABS: 40.0000 mg | ORAL_TABLET | Freq: Every day | ORAL | 0 refills | Status: AC

## 2023-06-02 MED ORDER — LIDOCAINE VISCOUS HCL 2 % MT SOLN
15.0000 mL | Freq: Once | OROMUCOSAL | Status: AC
  Administered 2023-06-02: 15 mL via ORAL
  Filled 2023-06-02: qty 15

## 2023-06-02 NOTE — ED Provider Notes (Signed)
[place] EMERGENCY DEPARTMENT AT [facility] Provider Note   CSN: [id number] Arrival date & time: 06/01/23  2047     History  Chief Complaint  Patient presents with   Generalized Body Aches   Heartburn    Lance Adams is a 37 y.o. male.  Presents to the emergency department for evaluation of bilateral rib pain, cough.  Symptoms ongoing for couple of days.  He does have history of GERD and feels like he is having some indigestion but complains of chest pain as well.  No known fever.  Reports a history of asthma when he was young but no problems since.  No abdominal pain, nausea or vomiting.       Home Medications Prior to Admission medications   Medication Sig Start Date End Date Taking? Authorizing Provider  benzonatate (TESSALON) 200 MG capsule Take 1 capsule (200 mg total) by mouth 3 (three) times daily as needed for cough. 06/02/23  Yes Latravion Graves, Canary Brim, MD  pantoprazole (PROTONIX) 40 MG tablet Take 1 tablet (40 mg total) by mouth daily. 06/02/23  Yes Garnet Chatmon, Canary Brim, MD  predniSONE (DELTASONE) 20 MG tablet Take 2 tablets (40 mg total) by mouth daily with breakfast. 06/02/23  Yes Milarose Savich, Canary Brim, MD  cyclobenzaprine (FLEXERIL) 10 MG tablet Take 1 tablet by mouth 3 times daily as needed for muscle spasm. Warning: May cause drowsiness. 07/27/21   Mardella Layman, MD  naproxen (NAPROSYN) 500 MG tablet Take 1 tablet (500 mg total) by mouth 2 (two) times daily with a meal. 07/24/21   Eber Hong, MD      Allergies    Ibuprofen and Tylenol [acetaminophen]    Review of Systems   Review of Systems  Physical Exam Updated Vital Signs BP (!) 138/90 (BP Location: Right Wrist)   Pulse 94   Temp 99.5 F (37.5 C) (Oral)   Resp 17   Ht 5\' 9"  (1.753 m)   Wt (!) 145.2 kg   SpO2 96%   BMI 47.26 kg/m  Physical Exam Vitals and nursing note reviewed.  Constitutional:      General: He is not in acute distress.    Appearance: He is well-developed.   HENT:     Head: Normocephalic and atraumatic.     Mouth/Throat:     Mouth: Mucous membranes are moist.  Eyes:     General: Vision grossly intact. Gaze aligned appropriately.     Extraocular Movements: Extraocular movements intact.     Conjunctiva/sclera: Conjunctivae normal.  Cardiovascular:     Rate and Rhythm: Normal rate and regular rhythm.     Pulses: Normal pulses.     Heart sounds: Normal heart sounds, S1 normal and S2 normal. No murmur heard.    No friction rub. No gallop.  Pulmonary:     Effort: Pulmonary effort is normal. No respiratory distress.     Breath sounds: Rhonchi present.  Abdominal:     Palpations: Abdomen is soft.     Tenderness: There is no abdominal tenderness. There is no guarding or rebound.     Hernia: No hernia is present.  Musculoskeletal:        General: No swelling.     Cervical back: Full passive range of motion without pain, normal range of motion and neck supple. No pain with movement, spinous process tenderness or muscular tenderness. Normal range of motion.     Right lower leg: No edema.     Left lower leg: No edema.  Skin:  General: Skin is warm and dry.     Capillary Refill: Capillary refill takes less than 2 seconds.     Findings: No ecchymosis, erythema, lesion or wound.  Neurological:     Mental Status: He is alert and oriented to person, place, and time.     GCS: GCS eye subscore is 4. GCS verbal subscore is 5. GCS motor subscore is 6.     Cranial Nerves: Cranial nerves 2-12 are intact.     Sensory: Sensation is intact.     Motor: Motor function is intact. No weakness or abnormal muscle tone.     Coordination: Coordination is intact.  Psychiatric:        Mood and Affect: Mood normal.        Speech: Speech normal.        Behavior: Behavior normal.     ED Results / Procedures / Treatments   Labs (all labs ordered are listed, but only abnormal results are displayed) Labs Reviewed  COMPREHENSIVE METABOLIC PANEL - Abnormal;  Notable for the following components:      Result Value   Glucose, Bld 111 (*)    All other components within normal limits  SARS CORONAVIRUS 2 BY RT PCR  CBC WITH DIFFERENTIAL/PLATELET  TROPONIN I (HIGH SENSITIVITY)  TROPONIN I (HIGH SENSITIVITY)    EKG EKG Interpretation Date/Time:  Saturday June 01 2023 20:58:57 EDT Ventricular Rate:  94 PR Interval:  158 QRS Duration:  86 QT Interval:  326 QTC Calculation: 407 R Axis:   82  Text Interpretation: Normal sinus rhythm Nonspecific T wave abnormality Abnormal ECG No previous ECGs available Confirmed by Gilda Crease [id number]) on 06/02/2023 12:15:00 AM  Radiology DG Chest 2 View  Result Date: 06/01/2023 CLINICAL DATA:  Burning sensation in chest EXAM: CHEST - 2 VIEW COMPARISON:  02/11/2012 FINDINGS: The heart size and mediastinal contours are within normal limits. Both lungs are clear. The visualized skeletal structures are unremarkable. IMPRESSION: No active cardiopulmonary disease. Electronically Signed   By: Sharlet Salina M.D.   On: 06/01/2023 22:19    Procedures Procedures    Medications Ordered in ED Medications  famotidine (PEPCID) tablet 20 mg (has no administration in time range)  alum & mag hydroxide-simeth (MAALOX/MYLANTA) 200-200-20 MG/5ML suspension 30 mL (has no administration in time range)    And  lidocaine (XYLOCAINE) 2 % viscous mouth solution 15 mL (has no administration in time range)  predniSONE (DELTASONE) tablet 60 mg (has no administration in time range)  albuterol (VENTOLIN HFA) 108 (90 Base) MCG/ACT inhaler 2 puff (has no administration in time range)    ED Course/ Medical Decision Making/ A&P                                 Medical Decision Making Amount and/or Complexity of Data Reviewed Labs: ordered. Radiology: ordered. ECG/medicine tests: ordered.   Differential Diagnosis considered includes, but not limited to: COVID-19; influenza; RSV; simple viral URI; pneumonia  Patient  presents with cough and URI symptoms.  Additionally, patient with bilateral rib pain and indigestion symptoms.  Abdominal exam is benign, nontender.  EKG without obvious ischemia, no ST elevations.  No cardiac history.  Lung exam reveals rhonchi but good air movement.  He is oxygenating well.  Troponin negative x 2.  All lab work reassuring.  Treat for acute bronchitis, indigestion.        Final Clinical Impression(s) / ED Diagnoses Final  diagnoses:  Bronchitis  Gastroesophageal reflux disease, unspecified whether esophagitis present    Rx / DC Orders ED Discharge Orders          Ordered    predniSONE (DELTASONE) 20 MG tablet  Daily with breakfast        06/02/23 0021    pantoprazole (PROTONIX) 40 MG tablet  Daily        06/02/23 0021    benzonatate (TESSALON) 200 MG capsule  3 times daily PRN        06/02/23 0021              Gilda Crease, MD 06/02/23 0021

## 2024-01-24 ENCOUNTER — Emergency Department (HOSPITAL_COMMUNITY)
Admission: EM | Admit: 2024-01-24 | Discharge: 2024-01-24 | Disposition: A | Discharge status: Home / Self Care | Payer: Self-pay | Attending: Emergency Medicine | Admitting: Emergency Medicine

## 2024-01-24 ENCOUNTER — Encounter (HOSPITAL_COMMUNITY): Payer: Self-pay

## 2024-01-24 ENCOUNTER — Other Ambulatory Visit: Payer: Self-pay

## 2024-01-24 DIAGNOSIS — Z0000 Encounter for general adult medical examination without abnormal findings: Secondary | ICD-10-CM

## 2024-01-24 NOTE — ED Triage Notes (Signed)
 Pov from home. Cc of his job needing a note saying he was okay to work. Says that he was feeling "under the weather" but feeling better now. Says he needs a note that says "no restrictions"  Went to [facility] twice but they did not write a note saying he was ok to work.  Works for [employer].

## 2024-01-24 NOTE — ED Provider Notes (Signed)
  [place] EMERGENCY DEPARTMENT AT [facility] Provider Note   CSN: [id number] Arrival date & time: 01/24/24  2010     History  Chief Complaint  Patient presents with   Work Note    Lance Adams is a 38 y.o. male.  HPI Patient requests a work to return to work.  In his words he feels" great," " blessed" and has no complaints.  His employer requests a work note.    Home Medications Prior to Admission medications   Medication Sig Start Date End Date Taking? Authorizing Provider  benzonatate  (TESSALON ) 200 MG capsule Take 1 capsule (200 mg total) by mouth 3 (three) times daily as needed for cough. 06/02/23   Ballard Bongo, MD  cyclobenzaprine  (FLEXERIL ) 10 MG tablet Take 1 tablet by mouth 3 times daily as needed for muscle spasm. Warning: May cause drowsiness. 07/27/21   Afton Albright, MD  naproxen  (NAPROSYN ) 500 MG tablet Take 1 tablet (500 mg total) by mouth 2 (two) times daily with a meal. 07/24/21   Early Glisson, MD  pantoprazole  (PROTONIX ) 40 MG tablet Take 1 tablet (40 mg total) by mouth daily. 06/02/23   Ballard Bongo, MD  predniSONE  (DELTASONE ) 20 MG tablet Take 2 tablets (40 mg total) by mouth daily with breakfast. 06/02/23   Pollina, Marine Sia, MD      Allergies    Ibuprofen  and Tylenol  [acetaminophen ]    Review of Systems   Review of Systems  Physical Exam Updated Vital Signs BP 137/80 (BP Location: Right Arm)   Pulse 82   Temp 97.8 F (36.6 C) (Oral)   Resp 19   Ht 5\' 10"  (1.778 m)   Wt (!) 158.8 kg   SpO2 95%   BMI 50.22 kg/m  Physical Exam  ED Results / Procedures / Treatments   Labs (all labs ordered are listed, but only abnormal results are displayed) Labs Reviewed - No data to display  EKG None  Radiology No results found.  Procedures Procedures    Medications Ordered in ED Medications - No data to display  ED Course/ Medical Decision Making/ A&P   Patient without complaints presents for work note.   Vital signs unremarkable work note provided patient discharged. Final Clinical Impression(s) / ED Diagnoses Final diagnoses:  Well adult health check    Rx / DC Orders ED Discharge Orders     None         Dorenda Gandy, MD 01/24/24 2033

## 2024-05-18 ENCOUNTER — Ambulatory Visit
Admission: EM | Admit: 2024-05-18 | Discharge: 2024-05-18 | Disposition: A | Discharge status: Home / Self Care | Attending: Family Medicine | Admitting: Family Medicine

## 2024-05-18 DIAGNOSIS — J069 Acute upper respiratory infection, unspecified: Secondary | ICD-10-CM

## 2024-05-18 DIAGNOSIS — J4521 Mild intermittent asthma with (acute) exacerbation: Secondary | ICD-10-CM

## 2024-05-18 LAB — POC SOFIA SARS ANTIGEN FIA: SARS Coronavirus 2 Ag: NEGATIVE

## 2024-05-18 MED ORDER — ALBUTEROL SULFATE HFA 108 (90 BASE) MCG/ACT IN AERS: 2.0000 | INHALATION_SPRAY | RESPIRATORY_TRACT | 0 refills | Status: AC | PRN

## 2024-05-18 MED ORDER — PROMETHAZINE-DM 6.25-15 MG/5ML PO SYRP: 5.0000 mL | ORAL_SOLUTION | Freq: Four times a day (QID) | ORAL | 0 refills | Status: AC | PRN

## 2024-05-18 MED ORDER — AZELASTINE HCL 0.1 % NA SOLN: 1.0000 | Freq: Two times a day (BID) | NASAL | 0 refills | Status: AC

## 2024-05-18 MED ORDER — PREDNISONE 20 MG PO TABS: 40.0000 mg | ORAL_TABLET | Freq: Every day | ORAL | 0 refills | Status: AC

## 2024-05-18 MED ORDER — DEXAMETHASONE SODIUM PHOSPHATE 10 MG/ML IJ SOLN
10.0000 mg | Freq: Once | INTRAMUSCULAR | Status: AC
  Administered 2024-05-18: 10 mg via INTRAMUSCULAR

## 2024-05-18 NOTE — Discharge Instructions (Signed)
 I prescribed prednisone , cough syrup, nasal spray and an albuterol  inhaler refill and we have given you a steroid shot today to start helping with your chest tightness and shortness of breath.  You may also take Mucinex, Coricidin HBP, nasal saline and use humidifiers.  Follow-up for worsening or unresolving symptoms.

## 2024-05-18 NOTE — ED Triage Notes (Signed)
 Pt being seen in UC for cough, shortness of breath, abdominal pain, chills, generalized body aches since yesterday. Pt denies fever, n/v/d. Pt reports taking mucinex to help with cough, with no relief.

## 2024-05-21 NOTE — ED Provider Notes (Signed)
 [facility]    CSN: [id number] Arrival date & time: 05/18/24  1826      History   Chief Complaint Chief Complaint  Patient presents with   Shortness of Breath   Abdominal Pain   Cough    HPI NNAEMEKA SAMSON is a 38 y.o. male.   Presenting today with 1 day history of cough, SOB, abdominal pain, chills, body aches, fatigue, wheezing, chest tightness. Denies fever, vomiting, diarrhea, rashes. Son sick with same sxs. Hx of asthma, states albuterol  is only providing momentary relief. Otherwise trying mucinex with no relief.     History reviewed. No pertinent past medical history.  There are no active problems to display for this patient.   History reviewed. No pertinent surgical history.     Home Medications    Prior to Admission medications   Medication Sig Start Date End Date Taking? Authorizing Provider  albuterol  (VENTOLIN  HFA) 108 (90 Base) MCG/ACT inhaler Inhale 2 puffs into the lungs every 4 (four) hours as needed. 05/18/24  Yes Stuart Vernell Norris, PA-C  azelastine  (ASTELIN ) 0.1 % nasal spray Place 1 spray into both nostrils 2 (two) times daily. Use in each nostril as directed 05/18/24  Yes Stuart Vernell Norris, PA-C  predniSONE  (DELTASONE ) 20 MG tablet Take 2 tablets (40 mg total) by mouth daily with breakfast. 05/18/24  Yes Stuart Vernell Norris, PA-C  promethazine -dextromethorphan (PROMETHAZINE -DM) 6.25-15 MG/5ML syrup Take 5 mLs by mouth 4 (four) times daily as needed. 05/18/24  Yes Stuart Vernell Norris, PA-C  benzonatate  (TESSALON ) 200 MG capsule Take 1 capsule (200 mg total) by mouth 3 (three) times daily as needed for cough. Patient not taking: Reported on 05/18/2024 06/02/23   Haze Lonni PARAS, MD  cyclobenzaprine  (FLEXERIL ) 10 MG tablet Take 1 tablet by mouth 3 times daily as needed for muscle spasm. Warning: May cause drowsiness. 07/27/21   Rolinda Rogue, MD  naproxen  (NAPROSYN ) 500 MG tablet Take 1 tablet (500 mg total) by mouth 2 (two) times  daily with a meal. 07/24/21   Cleotilde Rogue, MD  pantoprazole  (PROTONIX ) 40 MG tablet Take 1 tablet (40 mg total) by mouth daily. 06/02/23   Haze Lonni PARAS, MD  predniSONE  (DELTASONE ) 20 MG tablet Take 2 tablets (40 mg total) by mouth daily with breakfast. Patient not taking: Reported on 05/18/2024 06/02/23   Haze Lonni PARAS, MD    Family History Family History  Problem Relation Age of Onset   Hypertension Mother    Hypertension Father     Social History Social History   Tobacco Use   Smoking status: Every Day    Current packs/day: 1.00    Average packs/day: 1 pack/day for 10.0 years (10.0 ttl pk-yrs)    Types: Cigarettes   Smokeless tobacco: Never  Vaping Use   Vaping status: Never Used  Substance Use Topics   Alcohol use: Yes    Alcohol/week: 3.0 standard drinks of alcohol    Types: 3 Cans of beer per week    Comment: occasionally   Drug use: No     Allergies   Ibuprofen  and Tylenol  [acetaminophen ]   Review of Systems Review of Systems PER HPI  Physical Exam Triage Vital Signs ED Triage Vitals  Encounter Vitals Group     BP 05/18/24 1909 (!) 142/91     Girls Systolic BP Percentile --      Girls Diastolic BP Percentile --      Boys Systolic BP Percentile --      Boys Diastolic  BP Percentile --      Pulse Rate 05/18/24 1909 94     Resp 05/18/24 1909 20     Temp 05/18/24 1909 98.8 F (37.1 C)     Temp Source 05/18/24 1909 Oral     SpO2 05/18/24 1909 92 %     Weight --      Height --      Head Circumference --      Peak Flow --      Pain Score 05/18/24 1906 8     Pain Loc --      Pain Education --      Exclude from Growth Chart --    No data found.  Updated Vital Signs BP (!) 142/91 (BP Location: Right Arm)   Pulse 94   Temp 98.8 F (37.1 C) (Oral)   Resp 20   SpO2 92%   Visual Acuity Right Eye Distance:   Left Eye Distance:   Bilateral Distance:    Right Eye Near:   Left Eye Near:    Bilateral Near:     Physical Exam Vitals  and nursing note reviewed.  Constitutional:      Appearance: He is well-developed.  HENT:     Head: Atraumatic.     Right Ear: External ear normal.     Left Ear: External ear normal.     Nose: Rhinorrhea present.     Mouth/Throat:     Mouth: Mucous membranes are moist.     Pharynx: Oropharynx is clear. Posterior oropharyngeal erythema present. No oropharyngeal exudate.  Eyes:     Conjunctiva/sclera: Conjunctivae normal.     Pupils: Pupils are equal, round, and reactive to light.  Cardiovascular:     Rate and Rhythm: Normal rate and regular rhythm.  Pulmonary:     Effort: Pulmonary effort is normal. No respiratory distress.     Breath sounds: Wheezing present. No rales.     Comments: Diffuse moderate to severe wheezes b/l Musculoskeletal:        General: Normal range of motion.     Cervical back: Normal range of motion and neck supple.  Lymphadenopathy:     Cervical: No cervical adenopathy.  Skin:    General: Skin is warm and dry.  Neurological:     Mental Status: He is alert and oriented to person, place, and time.  Psychiatric:        Behavior: Behavior normal.      UC Treatments / Results  Labs (all labs ordered are listed, but only abnormal results are displayed) Labs Reviewed  POC SOFIA SARS ANTIGEN FIA    EKG   Radiology No results found.  Procedures Procedures (including critical care time)  Medications Ordered in UC Medications  dexamethasone  (DECADRON ) injection 10 mg (10 mg Intramuscular Given 05/18/24 2000)    Initial Impression / Assessment and Plan / UC Course  I have reviewed the triage vital signs and the nursing notes.  Pertinent labs & imaging results that were available during my care of the patient were reviewed by me and considered in my medical decision making (see chart for details).     Rapid COVID neg, suspect viral uri causing asthma exacerbation. Treat with IM decadron , prednisone , albuterol , phenergan  dm, astelin  and supportive  OTC medications and home care. Return for worsening sxs  Final Clinical Impressions(s) / UC Diagnoses   Final diagnoses:  Viral URI with cough  Mild intermittent asthma with acute exacerbation     Discharge Instructions  I prescribed prednisone , cough syrup, nasal spray and an albuterol  inhaler refill and we have given you a steroid shot today to start helping with your chest tightness and shortness of breath.  You may also take Mucinex, Coricidin HBP, nasal saline and use humidifiers.  Follow-up for worsening or unresolving symptoms.    ED Prescriptions     Medication Sig Dispense Auth. Provider   predniSONE  (DELTASONE ) 20 MG tablet Take 2 tablets (40 mg total) by mouth daily with breakfast. 10 tablet Stuart Vernell Norris, PA-C   promethazine -dextromethorphan (PROMETHAZINE -DM) 6.25-15 MG/5ML syrup Take 5 mLs by mouth 4 (four) times daily as needed. 100 mL Stuart Vernell Norris, PA-C   azelastine  (ASTELIN ) 0.1 % nasal spray Place 1 spray into both nostrils 2 (two) times daily. Use in each nostril as directed 30 mL Stuart Vernell Norris, PA-C   albuterol  (VENTOLIN  HFA) 108 (90 Base) MCG/ACT inhaler Inhale 2 puffs into the lungs every 4 (four) hours as needed. 18 g Stuart Vernell Norris, [place]      PDMP not reviewed this encounter.   Stuart Vernell Norris, PA-C 05/21/24 [phone]
